# Patient Record
Sex: Female | Born: 1945
Health system: Southern US, Community
[De-identification: ages and names within clinical notes are randomized; demographics above are authoritative.]

## PROBLEM LIST (undated history)

## (undated) DIAGNOSIS — N289 Disorder of kidney and ureter, unspecified: Secondary | ICD-10-CM

## (undated) DIAGNOSIS — I10 Essential (primary) hypertension: Secondary | ICD-10-CM

---

## 1998-02-04 ENCOUNTER — Ambulatory Visit (HOSPITAL_COMMUNITY): Admission: RE | Admit: 1998-02-04 | Discharge: 1998-02-04 | Payer: Self-pay | Admitting: Gastroenterology

## 1998-02-04 ENCOUNTER — Encounter: Payer: Self-pay | Admitting: Gastroenterology

## 1998-04-09 ENCOUNTER — Ambulatory Visit (HOSPITAL_COMMUNITY): Admission: RE | Admit: 1998-04-09 | Discharge: 1998-04-09 | Payer: Self-pay | Admitting: Gastroenterology

## 1998-07-08 ENCOUNTER — Encounter: Payer: Self-pay | Admitting: General Surgery

## 1998-07-12 ENCOUNTER — Observation Stay (HOSPITAL_COMMUNITY): Admission: RE | Admit: 1998-07-12 | Discharge: 1998-07-13 | Payer: Self-pay | Admitting: General Surgery

## 1999-03-23 ENCOUNTER — Encounter: Payer: Self-pay | Admitting: Family Medicine

## 1999-03-23 ENCOUNTER — Encounter: Admission: RE | Admit: 1999-03-23 | Discharge: 1999-03-23 | Payer: Self-pay | Admitting: Family Medicine

## 2000-04-02 ENCOUNTER — Encounter: Admission: RE | Admit: 2000-04-02 | Discharge: 2000-04-02 | Payer: Self-pay | Admitting: Family Medicine

## 2000-04-02 ENCOUNTER — Encounter: Payer: Self-pay | Admitting: Family Medicine

## 2000-07-09 ENCOUNTER — Other Ambulatory Visit: Admission: RE | Admit: 2000-07-09 | Discharge: 2000-07-09 | Payer: Self-pay | Admitting: Obstetrics and Gynecology

## 2001-04-15 ENCOUNTER — Encounter: Admission: RE | Admit: 2001-04-15 | Discharge: 2001-04-15 | Payer: Self-pay | Admitting: Family Medicine

## 2001-04-15 ENCOUNTER — Encounter: Payer: Self-pay | Admitting: Family Medicine

## 2001-06-25 ENCOUNTER — Encounter (INDEPENDENT_AMBULATORY_CARE_PROVIDER_SITE_OTHER): Payer: Self-pay | Admitting: Specialist

## 2001-06-25 ENCOUNTER — Ambulatory Visit (HOSPITAL_COMMUNITY): Admission: RE | Admit: 2001-06-25 | Discharge: 2001-06-25 | Payer: Self-pay | Admitting: Gastroenterology

## 2001-07-22 ENCOUNTER — Other Ambulatory Visit: Admission: RE | Admit: 2001-07-22 | Discharge: 2001-07-22 | Payer: Self-pay | Admitting: Obstetrics and Gynecology

## 2002-04-21 ENCOUNTER — Encounter: Payer: Self-pay | Admitting: Family Medicine

## 2002-04-21 ENCOUNTER — Ambulatory Visit (HOSPITAL_COMMUNITY): Admission: RE | Admit: 2002-04-21 | Discharge: 2002-04-21 | Payer: Self-pay | Admitting: Family Medicine

## 2003-04-27 ENCOUNTER — Ambulatory Visit (HOSPITAL_COMMUNITY): Admission: RE | Admit: 2003-04-27 | Discharge: 2003-04-27 | Payer: Self-pay | Admitting: Family Medicine

## 2004-05-13 ENCOUNTER — Ambulatory Visit (HOSPITAL_COMMUNITY): Admission: RE | Admit: 2004-05-13 | Discharge: 2004-05-13 | Payer: Self-pay | Admitting: Obstetrics and Gynecology

## 2005-05-22 ENCOUNTER — Ambulatory Visit (HOSPITAL_COMMUNITY): Admission: RE | Admit: 2005-05-22 | Discharge: 2005-05-22 | Payer: Self-pay | Admitting: Obstetrics and Gynecology

## 2006-05-28 ENCOUNTER — Ambulatory Visit (HOSPITAL_COMMUNITY): Admission: RE | Admit: 2006-05-28 | Discharge: 2006-05-28 | Payer: Self-pay | Admitting: Family Medicine

## 2006-06-06 ENCOUNTER — Encounter: Admission: RE | Admit: 2006-06-06 | Discharge: 2006-06-06 | Payer: Self-pay | Admitting: Family Medicine

## 2007-06-20 ENCOUNTER — Ambulatory Visit (HOSPITAL_COMMUNITY): Admission: RE | Admit: 2007-06-20 | Discharge: 2007-06-20 | Payer: Self-pay | Admitting: Obstetrics and Gynecology

## 2010-05-22 ENCOUNTER — Encounter: Payer: Self-pay | Admitting: Family Medicine

## 2010-09-16 NOTE — Procedures (Signed)
[facility]  Patient:    Brandy Salazar, Brandy Salazar Visit Number: [id number] MRN: [id number]          Service Type: END Location: ENDO Attending Physician:  Rafael Bihari Dictated by:   Elyse Jarvis Amedeo Plenty, M.D. Proc. Date: 06/25/01 Admit Date:  06/25/2001                             Procedure Report  PROCEDURE:  Colonoscopy.  INDICATION FOR PROCEDURE:  History of adenomatous colon polyps three years ago.  DESCRIPTION OF PROCEDURE:  The patient was placed in the left lateral decubitus position and placed on the pulse monitor with continuous low-flow oxygen delivered by nasal cannula.  She was sedated with 90 mg of IV Demerol and 10 mg of IV Versed.  The Olympus video colonoscope was inserted into the rectum and advanced to the cecum, confirmed by transillumination at McBurneys point and visualization of the ileocecal valve and appendiceal orifice.  The prep was good.  The cecum, ascending, and transverse colon all appeared normal with no masses, polyps, diverticula or other mucosal abnormalities.  Within the descending colon, there was a small 8 mm polyp which was fulgurated by hot biopsy, and there was a similar 8 mm polyp seen in the sigmoid colon.  Three or four other small but similar-appearing polyps were seen in the rectum and rectosigmoid area and were not fulgurated.  There were no diverticula or other mucosal abnormalities seen.  The colonoscope was then withdrawn, and the patient returned to the recovery room in stable condition.  She tolerated the procedure well, and there were no immediate complications.  IMPRESSION:  Sigmoid and descending colon polyps with diminutive rectal polyps.  PLAN:  Await histology and will probably pursue repeat colonoscopy in 3-5 years. Dictated by:   Elyse Jarvis Amedeo Plenty, M.D. Attending Physician:  Rafael Bihari DD:  06/25/01 TD:  06/25/01 Job: [id number]

## 2012-02-22 ENCOUNTER — Other Ambulatory Visit: Payer: Self-pay | Admitting: Gastroenterology

## 2013-08-21 ENCOUNTER — Other Ambulatory Visit (HOSPITAL_COMMUNITY)
Admission: RE | Admit: 2013-08-21 | Discharge: 2013-08-21 | Disposition: A | Discharge status: Home / Self Care | Payer: Medicare PPO | Source: Ambulatory Visit | Attending: Family Medicine | Admitting: Family Medicine

## 2013-08-21 ENCOUNTER — Other Ambulatory Visit: Payer: Self-pay | Admitting: Family Medicine

## 2013-08-21 DIAGNOSIS — Z124 Encounter for screening for malignant neoplasm of cervix: Secondary | ICD-10-CM | POA: Insufficient documentation

## 2016-03-17 ENCOUNTER — Other Ambulatory Visit: Payer: Self-pay | Admitting: Family Medicine

## 2016-03-17 DIAGNOSIS — Z1231 Encounter for screening mammogram for malignant neoplasm of breast: Secondary | ICD-10-CM

## 2016-04-12 ENCOUNTER — Ambulatory Visit
Admission: RE | Admit: 2016-04-12 | Discharge: 2016-04-12 | Disposition: A | Discharge status: Home / Self Care | Payer: Commercial Managed Care - HMO | Source: Ambulatory Visit | Attending: Family Medicine | Admitting: Family Medicine

## 2016-04-12 DIAGNOSIS — Z1231 Encounter for screening mammogram for malignant neoplasm of breast: Secondary | ICD-10-CM

## 2016-09-26 DIAGNOSIS — L720 Epidermal cyst: Secondary | ICD-10-CM | POA: Diagnosis not present

## 2016-09-26 DIAGNOSIS — I10 Essential (primary) hypertension: Secondary | ICD-10-CM | POA: Diagnosis not present

## 2016-09-26 DIAGNOSIS — E7800 Pure hypercholesterolemia, unspecified: Secondary | ICD-10-CM | POA: Diagnosis not present

## 2017-02-09 DIAGNOSIS — Z720 Tobacco use: Secondary | ICD-10-CM | POA: Diagnosis not present

## 2017-02-09 DIAGNOSIS — I10 Essential (primary) hypertension: Secondary | ICD-10-CM | POA: Diagnosis not present

## 2017-02-09 DIAGNOSIS — H269 Unspecified cataract: Secondary | ICD-10-CM | POA: Diagnosis not present

## 2017-02-09 DIAGNOSIS — Z9049 Acquired absence of other specified parts of digestive tract: Secondary | ICD-10-CM | POA: Diagnosis not present

## 2017-03-29 DIAGNOSIS — M79606 Pain in leg, unspecified: Secondary | ICD-10-CM | POA: Diagnosis not present

## 2017-03-29 DIAGNOSIS — L720 Epidermal cyst: Secondary | ICD-10-CM | POA: Diagnosis not present

## 2017-03-29 DIAGNOSIS — I10 Essential (primary) hypertension: Secondary | ICD-10-CM | POA: Diagnosis not present

## 2017-03-29 DIAGNOSIS — Z23 Encounter for immunization: Secondary | ICD-10-CM | POA: Diagnosis not present

## 2017-04-03 DIAGNOSIS — M67441 Ganglion, right hand: Secondary | ICD-10-CM | POA: Diagnosis not present

## 2017-04-03 DIAGNOSIS — M19041 Primary osteoarthritis, right hand: Secondary | ICD-10-CM | POA: Diagnosis not present

## 2017-04-03 DIAGNOSIS — M6740 Ganglion, unspecified site: Secondary | ICD-10-CM | POA: Diagnosis not present

## 2017-04-20 ENCOUNTER — Other Ambulatory Visit: Payer: Self-pay | Admitting: Family Medicine

## 2017-04-20 DIAGNOSIS — Z1231 Encounter for screening mammogram for malignant neoplasm of breast: Secondary | ICD-10-CM

## 2017-04-25 ENCOUNTER — Ambulatory Visit
Admission: RE | Admit: 2017-04-25 | Discharge: 2017-04-25 | Disposition: A | Discharge status: Home / Self Care | Payer: Commercial Managed Care - HMO | Source: Ambulatory Visit | Attending: Family Medicine | Admitting: Family Medicine

## 2017-04-25 DIAGNOSIS — Z1231 Encounter for screening mammogram for malignant neoplasm of breast: Secondary | ICD-10-CM | POA: Diagnosis not present

## 2017-08-31 DIAGNOSIS — E876 Hypokalemia: Secondary | ICD-10-CM | POA: Diagnosis not present

## 2017-08-31 DIAGNOSIS — I10 Essential (primary) hypertension: Secondary | ICD-10-CM | POA: Diagnosis not present

## 2017-08-31 DIAGNOSIS — Z720 Tobacco use: Secondary | ICD-10-CM | POA: Diagnosis not present

## 2017-08-31 DIAGNOSIS — Z6824 Body mass index (BMI) 24.0-24.9, adult: Secondary | ICD-10-CM | POA: Diagnosis not present

## 2017-08-31 DIAGNOSIS — H269 Unspecified cataract: Secondary | ICD-10-CM | POA: Diagnosis not present

## 2017-10-05 DIAGNOSIS — I10 Essential (primary) hypertension: Secondary | ICD-10-CM | POA: Diagnosis not present

## 2017-10-05 DIAGNOSIS — M79606 Pain in leg, unspecified: Secondary | ICD-10-CM | POA: Diagnosis not present

## 2017-10-05 DIAGNOSIS — E7800 Pure hypercholesterolemia, unspecified: Secondary | ICD-10-CM | POA: Diagnosis not present

## 2017-10-05 DIAGNOSIS — Z1159 Encounter for screening for other viral diseases: Secondary | ICD-10-CM | POA: Diagnosis not present

## 2017-10-05 DIAGNOSIS — R51 Headache: Secondary | ICD-10-CM | POA: Diagnosis not present

## 2017-10-05 DIAGNOSIS — L720 Epidermal cyst: Secondary | ICD-10-CM | POA: Diagnosis not present

## 2018-01-18 DIAGNOSIS — D127 Benign neoplasm of rectosigmoid junction: Secondary | ICD-10-CM | POA: Diagnosis not present

## 2018-01-18 DIAGNOSIS — Z86010 Personal history of colonic polyps: Secondary | ICD-10-CM | POA: Diagnosis not present

## 2018-01-18 DIAGNOSIS — K5730 Diverticulosis of large intestine without perforation or abscess without bleeding: Secondary | ICD-10-CM | POA: Diagnosis not present

## 2018-01-18 DIAGNOSIS — D123 Benign neoplasm of transverse colon: Secondary | ICD-10-CM | POA: Diagnosis not present

## 2018-01-18 DIAGNOSIS — D122 Benign neoplasm of ascending colon: Secondary | ICD-10-CM | POA: Diagnosis not present

## 2018-01-22 DIAGNOSIS — D127 Benign neoplasm of rectosigmoid junction: Secondary | ICD-10-CM | POA: Diagnosis not present

## 2018-01-22 DIAGNOSIS — D123 Benign neoplasm of transverse colon: Secondary | ICD-10-CM | POA: Diagnosis not present

## 2018-01-22 DIAGNOSIS — D122 Benign neoplasm of ascending colon: Secondary | ICD-10-CM | POA: Diagnosis not present

## 2018-01-24 DIAGNOSIS — K625 Hemorrhage of anus and rectum: Secondary | ICD-10-CM | POA: Diagnosis not present

## 2018-01-28 DIAGNOSIS — K625 Hemorrhage of anus and rectum: Secondary | ICD-10-CM | POA: Diagnosis not present

## 2018-03-18 DIAGNOSIS — I10 Essential (primary) hypertension: Secondary | ICD-10-CM | POA: Diagnosis not present

## 2018-03-18 DIAGNOSIS — Z86010 Personal history of colonic polyps: Secondary | ICD-10-CM | POA: Diagnosis not present

## 2018-03-18 DIAGNOSIS — R030 Elevated blood-pressure reading, without diagnosis of hypertension: Secondary | ICD-10-CM | POA: Diagnosis not present

## 2018-04-16 DIAGNOSIS — I10 Essential (primary) hypertension: Secondary | ICD-10-CM | POA: Diagnosis not present

## 2018-04-16 DIAGNOSIS — K589 Irritable bowel syndrome without diarrhea: Secondary | ICD-10-CM | POA: Diagnosis not present

## 2018-04-16 DIAGNOSIS — Z23 Encounter for immunization: Secondary | ICD-10-CM | POA: Diagnosis not present

## 2018-05-02 ENCOUNTER — Encounter (HOSPITAL_COMMUNITY): Admission: EM | Disposition: E | Payer: Self-pay | Source: Home / Self Care | Attending: Internal Medicine

## 2018-05-02 ENCOUNTER — Emergency Department (HOSPITAL_COMMUNITY): Payer: Medicare PPO

## 2018-05-02 ENCOUNTER — Encounter (HOSPITAL_COMMUNITY): Payer: Self-pay

## 2018-05-02 ENCOUNTER — Inpatient Hospital Stay (HOSPITAL_COMMUNITY): Payer: Medicare PPO

## 2018-05-02 DIAGNOSIS — N184 Chronic kidney disease, stage 4 (severe): Secondary | ICD-10-CM | POA: Diagnosis not present

## 2018-05-02 DIAGNOSIS — D539 Nutritional anemia, unspecified: Secondary | ICD-10-CM | POA: Diagnosis present

## 2018-05-02 DIAGNOSIS — I255 Ischemic cardiomyopathy: Secondary | ICD-10-CM | POA: Diagnosis present

## 2018-05-02 DIAGNOSIS — R0989 Other specified symptoms and signs involving the circulatory and respiratory systems: Secondary | ICD-10-CM | POA: Diagnosis not present

## 2018-05-02 DIAGNOSIS — I272 Pulmonary hypertension, unspecified: Secondary | ICD-10-CM | POA: Diagnosis present

## 2018-05-02 DIAGNOSIS — I340 Nonrheumatic mitral (valve) insufficiency: Secondary | ICD-10-CM | POA: Diagnosis not present

## 2018-05-02 DIAGNOSIS — N2581 Secondary hyperparathyroidism of renal origin: Secondary | ICD-10-CM | POA: Diagnosis present

## 2018-05-02 DIAGNOSIS — D649 Anemia, unspecified: Secondary | ICD-10-CM | POA: Diagnosis not present

## 2018-05-02 DIAGNOSIS — Z515 Encounter for palliative care: Secondary | ICD-10-CM | POA: Diagnosis not present

## 2018-05-02 DIAGNOSIS — Z86010 Personal history of colonic polyps: Secondary | ICD-10-CM

## 2018-05-02 DIAGNOSIS — E875 Hyperkalemia: Secondary | ICD-10-CM | POA: Diagnosis not present

## 2018-05-02 DIAGNOSIS — I370 Nonrheumatic pulmonary valve stenosis: Secondary | ICD-10-CM

## 2018-05-02 DIAGNOSIS — T508X5A Adverse effect of diagnostic agents, initial encounter: Secondary | ICD-10-CM | POA: Diagnosis not present

## 2018-05-02 DIAGNOSIS — I130 Hypertensive heart and chronic kidney disease with heart failure and stage 1 through stage 4 chronic kidney disease, or unspecified chronic kidney disease: Secondary | ICD-10-CM | POA: Diagnosis present

## 2018-05-02 DIAGNOSIS — I5021 Acute systolic (congestive) heart failure: Secondary | ICD-10-CM | POA: Diagnosis present

## 2018-05-02 DIAGNOSIS — J811 Chronic pulmonary edema: Secondary | ICD-10-CM

## 2018-05-02 DIAGNOSIS — R05 Cough: Secondary | ICD-10-CM

## 2018-05-02 DIAGNOSIS — R570 Cardiogenic shock: Secondary | ICD-10-CM

## 2018-05-02 DIAGNOSIS — E669 Obesity, unspecified: Secondary | ICD-10-CM | POA: Diagnosis present

## 2018-05-02 DIAGNOSIS — R34 Anuria and oliguria: Secondary | ICD-10-CM | POA: Diagnosis present

## 2018-05-02 DIAGNOSIS — I361 Nonrheumatic tricuspid (valve) insufficiency: Secondary | ICD-10-CM

## 2018-05-02 DIAGNOSIS — R059 Cough, unspecified: Secondary | ICD-10-CM

## 2018-05-02 DIAGNOSIS — I472 Ventricular tachycardia: Secondary | ICD-10-CM | POA: Diagnosis not present

## 2018-05-02 DIAGNOSIS — I480 Paroxysmal atrial fibrillation: Secondary | ICD-10-CM | POA: Diagnosis not present

## 2018-05-02 DIAGNOSIS — Z7189 Other specified counseling: Secondary | ICD-10-CM | POA: Diagnosis not present

## 2018-05-02 DIAGNOSIS — D696 Thrombocytopenia, unspecified: Secondary | ICD-10-CM | POA: Diagnosis not present

## 2018-05-02 DIAGNOSIS — I2510 Atherosclerotic heart disease of native coronary artery without angina pectoris: Secondary | ICD-10-CM | POA: Diagnosis present

## 2018-05-02 DIAGNOSIS — I493 Ventricular premature depolarization: Secondary | ICD-10-CM | POA: Diagnosis not present

## 2018-05-02 DIAGNOSIS — I081 Rheumatic disorders of both mitral and tricuspid valves: Secondary | ICD-10-CM | POA: Diagnosis present

## 2018-05-02 DIAGNOSIS — E872 Acidosis: Secondary | ICD-10-CM | POA: Diagnosis not present

## 2018-05-02 DIAGNOSIS — I2102 ST elevation (STEMI) myocardial infarction involving left anterior descending coronary artery: Secondary | ICD-10-CM | POA: Diagnosis not present

## 2018-05-02 DIAGNOSIS — E1122 Type 2 diabetes mellitus with diabetic chronic kidney disease: Secondary | ICD-10-CM | POA: Diagnosis not present

## 2018-05-02 DIAGNOSIS — N170 Acute kidney failure with tubular necrosis: Secondary | ICD-10-CM | POA: Diagnosis not present

## 2018-05-02 DIAGNOSIS — D631 Anemia in chronic kidney disease: Secondary | ICD-10-CM | POA: Diagnosis not present

## 2018-05-02 DIAGNOSIS — K5900 Constipation, unspecified: Secondary | ICD-10-CM | POA: Diagnosis not present

## 2018-05-02 DIAGNOSIS — I213 ST elevation (STEMI) myocardial infarction of unspecified site: Secondary | ICD-10-CM | POA: Diagnosis not present

## 2018-05-02 DIAGNOSIS — Z23 Encounter for immunization: Secondary | ICD-10-CM | POA: Diagnosis not present

## 2018-05-02 DIAGNOSIS — N186 End stage renal disease: Secondary | ICD-10-CM | POA: Diagnosis not present

## 2018-05-02 DIAGNOSIS — I2119 ST elevation (STEMI) myocardial infarction involving other coronary artery of inferior wall: Secondary | ICD-10-CM | POA: Diagnosis not present

## 2018-05-02 DIAGNOSIS — E785 Hyperlipidemia, unspecified: Secondary | ICD-10-CM

## 2018-05-02 DIAGNOSIS — I509 Heart failure, unspecified: Secondary | ICD-10-CM

## 2018-05-02 DIAGNOSIS — E1165 Type 2 diabetes mellitus with hyperglycemia: Secondary | ICD-10-CM | POA: Diagnosis present

## 2018-05-02 DIAGNOSIS — Z992 Dependence on renal dialysis: Secondary | ICD-10-CM | POA: Diagnosis not present

## 2018-05-02 DIAGNOSIS — K921 Melena: Secondary | ICD-10-CM | POA: Diagnosis not present

## 2018-05-02 DIAGNOSIS — E118 Type 2 diabetes mellitus with unspecified complications: Secondary | ICD-10-CM | POA: Diagnosis not present

## 2018-05-02 DIAGNOSIS — R0602 Shortness of breath: Secondary | ICD-10-CM | POA: Diagnosis not present

## 2018-05-02 DIAGNOSIS — I2109 ST elevation (STEMI) myocardial infarction involving other coronary artery of anterior wall: Principal | ICD-10-CM | POA: Diagnosis present

## 2018-05-02 DIAGNOSIS — I4892 Unspecified atrial flutter: Secondary | ICD-10-CM | POA: Diagnosis not present

## 2018-05-02 DIAGNOSIS — N179 Acute kidney failure, unspecified: Secondary | ICD-10-CM | POA: Diagnosis not present

## 2018-05-02 DIAGNOSIS — E876 Hypokalemia: Secondary | ICD-10-CM | POA: Diagnosis not present

## 2018-05-02 DIAGNOSIS — Z66 Do not resuscitate: Secondary | ICD-10-CM | POA: Diagnosis not present

## 2018-05-02 DIAGNOSIS — E871 Hypo-osmolality and hyponatremia: Secondary | ICD-10-CM | POA: Diagnosis not present

## 2018-05-02 DIAGNOSIS — I219 Acute myocardial infarction, unspecified: Secondary | ICD-10-CM

## 2018-05-02 DIAGNOSIS — N185 Chronic kidney disease, stage 5: Secondary | ICD-10-CM

## 2018-05-02 DIAGNOSIS — J810 Acute pulmonary edema: Secondary | ICD-10-CM

## 2018-05-02 DIAGNOSIS — T82594A Other mechanical complication of infusion catheter, initial encounter: Secondary | ICD-10-CM

## 2018-05-02 DIAGNOSIS — I5023 Acute on chronic systolic (congestive) heart failure: Secondary | ICD-10-CM | POA: Diagnosis not present

## 2018-05-02 DIAGNOSIS — Z79899 Other long term (current) drug therapy: Secondary | ICD-10-CM

## 2018-05-02 DIAGNOSIS — F17210 Nicotine dependence, cigarettes, uncomplicated: Secondary | ICD-10-CM | POA: Diagnosis present

## 2018-05-02 DIAGNOSIS — I4891 Unspecified atrial fibrillation: Secondary | ICD-10-CM | POA: Diagnosis not present

## 2018-05-02 DIAGNOSIS — Z4682 Encounter for fitting and adjustment of non-vascular catheter: Secondary | ICD-10-CM | POA: Diagnosis not present

## 2018-05-02 HISTORY — DX: Disorder of kidney and ureter, unspecified: N28.9

## 2018-05-02 HISTORY — DX: Essential (primary) hypertension: I10

## 2018-05-02 LAB — DIFFERENTIAL
Abs Immature Granulocytes: 0.37 10*3/uL — ABNORMAL HIGH (ref 0.00–0.07)
BASOS ABS: 0 10*3/uL (ref 0.0–0.1)
Basophils Relative: 0 %
Eosinophils Absolute: 0.1 10*3/uL (ref 0.0–0.5)
Eosinophils Relative: 0 %
IMMATURE GRANULOCYTES: 2 %
LYMPHS PCT: 12 %
Lymphs Abs: 2.9 10*3/uL (ref 0.7–4.0)
Monocytes Absolute: 1.1 10*3/uL — ABNORMAL HIGH (ref 0.1–1.0)
Monocytes Relative: 5 %
NEUTROS ABS: 18.8 10*3/uL — AB (ref 1.7–7.7)
Neutrophils Relative %: 81 %

## 2018-05-02 LAB — LIPID PANEL
CHOLESTEROL: 211 mg/dL — AB (ref 0–200)
HDL: 87 mg/dL (ref >40)
LDL Cholesterol: 108 mg/dL — ABNORMAL HIGH (ref 0–99)
Total CHOL/HDL Ratio: 2.4 RATIO
Triglycerides: 79 mg/dL (ref <150)
VLDL: 16 mg/dL (ref 0–40)

## 2018-05-02 LAB — I-STAT TROPONIN, ED: Troponin i, poc: >30 ng/mL (ref 0.00–0.08)

## 2018-05-02 LAB — CBC
HCT: 35.2 % — ABNORMAL LOW (ref 36.0–46.0)
HEMOGLOBIN: 11.8 g/dL — AB (ref 12.0–15.0)
MCH: 32.9 pg (ref 26.0–34.0)
MCHC: 33.5 g/dL (ref 30.0–36.0)
MCV: 98.1 fL (ref 80.0–100.0)
NRBC: 0 % (ref 0.0–0.2)
Platelets: 177 10*3/uL (ref 150–400)
RBC: 3.59 MIL/uL — ABNORMAL LOW (ref 3.87–5.11)
RDW: 13.3 % (ref 11.5–15.5)
WBC: 23.3 10*3/uL — AB (ref 4.0–10.5)

## 2018-05-02 LAB — I-STAT CHEM 8, ED
BUN: 71 mg/dL — ABNORMAL HIGH (ref 8–23)
Calcium, Ion: 1.05 mmol/L — ABNORMAL LOW (ref 1.15–1.40)
Chloride: 95 mmol/L — ABNORMAL LOW (ref 98–111)
Creatinine, Ser: 5.1 mg/dL — ABNORMAL HIGH (ref 0.44–1.00)
Glucose, Bld: 192 mg/dL — ABNORMAL HIGH (ref 70–99)
HCT: 40 % (ref 36.0–46.0)
Hemoglobin: 13.6 g/dL (ref 12.0–15.0)
POTASSIUM: 4.2 mmol/L (ref 3.5–5.1)
Sodium: 131 mmol/L — ABNORMAL LOW (ref 135–145)
TCO2: 20 mmol/L — ABNORMAL LOW (ref 22–32)

## 2018-05-02 LAB — COMPREHENSIVE METABOLIC PANEL
ALBUMIN: 3.2 g/dL — AB (ref 3.5–5.0)
ALK PHOS: 60 U/L (ref 38–126)
ALT: 96 U/L — AB (ref 0–44)
AST: 203 U/L — ABNORMAL HIGH (ref 15–41)
Anion gap: 25 — ABNORMAL HIGH (ref 5–15)
BUN: 69 mg/dL — ABNORMAL HIGH (ref 8–23)
CALCIUM: 9.2 mg/dL (ref 8.9–10.3)
CO2: 17 mmol/L — AB (ref 22–32)
CREATININE: 5.19 mg/dL — AB (ref 0.44–1.00)
Chloride: 93 mmol/L — ABNORMAL LOW (ref 98–111)
GFR calc Af Amer: 9 mL/min — ABNORMAL LOW (ref >60)
GFR calc non Af Amer: 8 mL/min — ABNORMAL LOW (ref >60)
GLUCOSE: 192 mg/dL — AB (ref 70–99)
Potassium: 4.2 mmol/L (ref 3.5–5.1)
SODIUM: 135 mmol/L (ref 135–145)
Total Bilirubin: 0.6 mg/dL (ref 0.3–1.2)
Total Protein: 7.2 g/dL (ref 6.5–8.1)

## 2018-05-02 LAB — TROPONIN I: Troponin I: 62.64 ng/mL (ref <0.03)

## 2018-05-02 LAB — PROTIME-INR
INR: 1.34
Prothrombin Time: 16.4 seconds — ABNORMAL HIGH (ref 11.4–15.2)

## 2018-05-02 LAB — APTT: aPTT: 30 seconds (ref 24–36)

## 2018-05-02 SURGERY — CORONARY/GRAFT ACUTE MI REVASCULARIZATION
Anesthesia: LOCAL

## 2018-05-02 MED ORDER — ASPIRIN 81 MG PO CHEW
324.0000 mg | CHEWABLE_TABLET | Freq: Once | ORAL | Status: AC
  Administered 2018-05-02: 324 mg via ORAL
  Filled 2018-05-02: qty 4

## 2018-05-02 MED ORDER — HEPARIN (PORCINE) 25000 UT/250ML-% IV SOLN
800.0000 [IU]/h | INTRAVENOUS | Status: DC
  Administered 2018-05-02 – 2018-05-05 (×2): 750 [IU]/h via INTRAVENOUS
  Filled 2018-05-02 (×3): qty 250

## 2018-05-02 MED ORDER — SODIUM CHLORIDE 0.9 % IV SOLN
INTRAVENOUS | Status: DC
  Administered 2018-05-02: 500 mL via INTRAVENOUS
  Administered 2018-05-09 – 2018-05-11 (×3): via INTRAVENOUS

## 2018-05-02 MED ORDER — NOREPINEPHRINE BITARTRATE 1 MG/ML IV SOLN
0.0000 ug/min | INTRAVENOUS | Status: DC
  Administered 2018-05-02: 2 ug/min via INTRAVENOUS
  Administered 2018-05-03: 11 ug/min via INTRAVENOUS
  Administered 2018-05-15: 2 ug/min via INTRAVENOUS
  Filled 2018-05-02 (×4): qty 4

## 2018-05-02 MED ORDER — HEPARIN SODIUM (PORCINE) 5000 UNIT/ML IJ SOLN
60.0000 [IU]/kg | Freq: Once | INTRAMUSCULAR | Status: AC
  Administered 2018-05-02: 3600 [IU] via INTRAVENOUS
  Filled 2018-05-02: qty 1

## 2018-05-02 NOTE — Progress Notes (Addendum)
Chaplain responded to STEMI.  Neighbor Claudette who is friend and support on behalf of daughter who is in [place] was present.  Provided emotional and spiritual support to patient and friend.  Spoke briefly to daughter on phone to provide hospital telephone.  Patient appears weak and tired, but welcomed prayer.    Update: Chaplain encountered Claudette, patient's neighbor and support in [place] waiting area.  Facilitated communication with new medical staff and later escorted her to see patient.  Chaplain spoke briefly to patient, who appeared less pained, but very  sleepy.  RN mentioned that patient does not have a HCPOA/AD; RN had just spoken to patient about that and mentioned that it could be a good time for chaplain to broach the subject.  However, upon suggestion to patient that this could be a topic for conversation tomorrow, patient showed hesitation.  Chaplain suggested it could be a conversation for tomorrow. Patient nodded.  Will refer to unit chaplain.   Daughter in [place] is making arrangements to request FMLA and a flight to come to [facility] tomorrow.   Please contact as needed for ongoing support.  Luana Shu [phone]    05/03/2018 2200  Clinical Encounter Type  Visited With Patient and family together  Visit Type Initial;Other (Comment) (STEMI)  Referral From Care management  Consult/Referral To Chaplain  Spiritual Encounters  Spiritual Needs Prayer  Stress Factors  Patient Stress Factors Health changes  Family Stress Factors Family relationships;Health changes

## 2018-05-02 NOTE — ED Notes (Signed)
Activated code stemi @ 21:37 per Dr Tamera Punt

## 2018-05-02 NOTE — ED Notes (Signed)
EDP and Cardiologist explained plan of care to pt. and family ,

## 2018-05-02 NOTE — Progress Notes (Signed)
ANTICOAGULATION CONSULT NOTE - Initial Consult  Pharmacy Consult for heparin Indication: chest pain/ACS  Not on File  Patient Measurements: Weight: 132 lb (59.9 kg) Heparin Dosing Weight: 60kg  Vital Signs: Temp: 97.6 F (36.4 C) (01/02 2120) Temp Source: Oral (01/02 2120) BP: 75/60 (01/02 2200) Pulse Rate: 72 (01/02 2200)  Labs: Recent Labs    05/18/2018 2202  HGB 13.6  HCT 40.0  CREATININE 5.10*    CrCl cannot be calculated (Unknown ideal weight.).   Medical History: Past Medical History:  Diagnosis Date  . Hypertension   . Renal disorder    Assessment: 73 year old female presented to [facility] with weakness, ST elevations noted on EKG. Unfortunately after interview it would seem that patient's heart attack was several days ago, to complicate matters scr is now 5. Cath was cancelled. Will continue IV heparin for now for medical management. CBC within normal limits.   Goal of Therapy:  Heparin level 0.3-0.7 units/ml Monitor platelets by anticoagulation protocol: Yes   Plan:  Start heparin infusion at 750  units/hr Check anti-Xa level in 8 hours and daily while on heparin Continue to monitor H&H and platelets  Erin Hearing PharmD., BCPS Clinical Pharmacist 05/15/2018 10:20 PM

## 2018-05-02 NOTE — ED Provider Notes (Signed)
[facility] EMERGENCY DEPARTMENT Provider Note   CSN: [id number] Arrival date & time: 05/22/2018  2113     History   Chief Complaint Chief Complaint  Patient presents with  . Code STEMI    HPI Brandy Salazar is a 73 y.o. female.  Patient is a 73 year old female with a history of hypertension and kidney disease who presents as a code STEMI.  She was in triage and was noted to have ST elevation on her EKG.  Code STEMI was activated from triage.  She reports a 4-day history of generalized weakness with intermittent chest pain and tingling in both of her hands.  She also has some shortness of breath which worsened today and prompted her to come to the emergency room.  She denies any known history of cardiac disease.  She denies any current chest pain.     Past Medical History:  Diagnosis Date  . Hypertension   . Renal disorder     Patient Active Problem List   Diagnosis Date Noted  . ST elevation (STEMI) myocardial infarction ([place]) 05/04/2018    History reviewed. No pertinent surgical history.   OB History   No obstetric history on file.      Home Medications    Prior to Admission medications   Medication Sig Start Date End Date Taking? Authorizing Provider  cloNIDine (CATAPRES) 0.1 MG tablet Take 0.1 mg by mouth 2 (two) times daily.   Yes [provider]  hydrochlorothiazide (HYDRODIURIL) 25 MG tablet Take 25 mg by mouth daily.   Yes [provider]  lisinopril (PRINIVIL,ZESTRIL) 40 MG tablet Take 40 mg by mouth daily.   Yes [provider]  metoprolol succinate (TOPROL-XL) 100 MG 24 hr tablet Take 100 mg by mouth daily. Take with or immediately following a meal.   Yes [provider]  potassium chloride SA (K-DUR,KLOR-CON) 20 MEQ tablet Take 20 mEq by mouth daily.   Yes [provider]  verapamil (CALAN-SR) 240 MG CR tablet Take 240 mg by mouth daily.   Yes [provider]    Family History No  family history on file.  Social History Social History   Tobacco Use  . Smoking status: Not on file  Substance Use Topics  . Alcohol use: Not on file  . Drug use: Not on file     Allergies   Patient has no known allergies.   Review of Systems Review of Systems  Constitutional: Positive for fatigue. Negative for chills, diaphoresis and fever.  HENT: Positive for congestion. Negative for rhinorrhea and sneezing.   Eyes: Negative.   Respiratory: Positive for cough and shortness of breath. Negative for chest tightness.   Cardiovascular: Positive for chest pain. Negative for leg swelling.  Gastrointestinal: Negative for abdominal pain, blood in stool, diarrhea, nausea and vomiting.  Genitourinary: Negative for difficulty urinating, flank pain, frequency and hematuria.  Musculoskeletal: Negative for arthralgias and back pain.  Skin: Negative for rash.  Neurological: Positive for numbness (Tingling to both hands). Negative for dizziness, speech difficulty, weakness and headaches.     Physical Exam Updated Vital Signs BP (!) 82/64 (BP Location: Right Arm)   Pulse 68   Temp 97.6 F (36.4 C) (Oral)   Resp (!) 22   Wt 59.9 kg   SpO2 95%   Physical Exam Constitutional:      Appearance: She is well-developed.  HENT:     Head: Normocephalic and atraumatic.  Eyes:     Pupils: Pupils  are equal, round, and reactive to light.  Neck:     Musculoskeletal: Normal range of motion and neck supple.  Cardiovascular:     Rate and Rhythm: Normal rate and regular rhythm.     Heart sounds: Murmur present.  Pulmonary:     Effort: Pulmonary effort is normal. No respiratory distress.     Breath sounds: Normal breath sounds. No wheezing or rales.  Chest:     Chest wall: No tenderness.  Abdominal:     General: Bowel sounds are normal.     Palpations: Abdomen is soft.     Tenderness: There is no abdominal tenderness. There is no guarding or rebound.  Musculoskeletal: Normal range of  motion.  Lymphadenopathy:     Cervical: No cervical adenopathy.  Skin:    General: Skin is warm and dry.     Findings: No rash.  Neurological:     Mental Status: She is alert and oriented to person, place, and time.      ED Treatments / Results  Labs (all labs ordered are listed, but only abnormal results are displayed) Labs Reviewed  PROTIME-INR - Abnormal; Notable for the following components:      Result Value   Prothrombin Time 16.4 (*)    All other components within normal limits  CBC - Abnormal; Notable for the following components:   WBC 23.3 (*)    RBC 3.59 (*)    Hemoglobin 11.8 (*)    HCT 35.2 (*)    All other components within normal limits  DIFFERENTIAL - Abnormal; Notable for the following components:   Neutro Abs 18.8 (*)    Monocytes Absolute 1.1 (*)    Abs Immature Granulocytes 0.37 (*)    All other components within normal limits  LIPID PANEL - Abnormal; Notable for the following components:   Cholesterol 211 (*)    LDL Cholesterol 108 (*)    All other components within normal limits  I-STAT TROPONIN, ED - Abnormal; Notable for the following components:   Troponin i, poc >30.00 (*)    All other components within normal limits  I-STAT CHEM 8, ED - Abnormal; Notable for the following components:   Sodium 131 (*)    Chloride 95 (*)    BUN 71 (*)    Creatinine, Ser 5.10 (*)    Glucose, Bld 192 (*)    Calcium, Ion 1.05 (*)    TCO2 20 (*)    All other components within normal limits  APTT  COMPREHENSIVE METABOLIC PANEL  TROPONIN I  HEPARIN LEVEL (UNFRACTIONATED)  CBC    EKG EKG Interpretation  Date/Time:  Thursday May 02 2018 21:31:10 EST Ventricular Rate:  76 PR Interval:  122 QRS Duration: 96 QT Interval:  446 QTC Calculation: 501 R Axis:   -32 Text Interpretation:  Normal sinus rhythm Left axis deviation Lateral injury pattern Abnormal ECG ** ** ACUTE MI / STEMI ** ** Confirmed by Sherwood Gambler [phone]) on 05/13/2018 9:36:12  PM   Radiology Dg Chest Port 1 View  Result Date: 05/17/2018 CLINICAL DATA:  STEMI EXAM: PORTABLE CHEST 1 VIEW COMPARISON:  Report 07/08/1998 FINDINGS: Mild to moderate cardiomegaly. No acute consolidation or effusion. Aortic atherosclerosis. No pneumothorax. IMPRESSION: No active disease.  Cardiomegaly without edema. Electronically Signed   By: Donavan Foil M.D.   On: 05/11/2018 22:02    Procedures Procedures (including critical care time)  Medications Ordered in ED Medications  0.9 %  sodium chloride infusion (500 mLs Intravenous New Bag/Given 05/14/2018  2204)  heparin ADULT infusion 100 units/mL (25000 units/25mL sodium chloride 0.45%) (750 Units/hr Intravenous New Bag/Given 05/09/2018 2221)  norepinephrine (LEVOPHED) 4 mg in dextrose 5 % 250 mL (0.016 mg/mL) infusion (has no administration in time range)  aspirin chewable tablet 324 mg (324 mg Oral Given 05/05/2018 2159)  heparin injection 3,600 Units (3,600 Units Intravenous Given 05/08/2018 2200)     Initial Impression / Assessment and Plan / ED Course  I have reviewed the triage vital signs and the nursing notes.  Pertinent labs & imaging results that were available during my care of the patient were reviewed by me and considered in my medical decision making (see chart for details).     Patient is a 73 year old female who presents with 4-day history of chest pain shortness of breath and generalized weakness with tingling in her fingers.  She does not have any focal neurologic deficits.  Her EKG shows evidence of a STEMI.  Her troponin is markedly elevated.  Cardiology has seen the patient and felt that it would not be beneficial at this point to take her to the Cath Lab.  She also has a markedly elevated creatinine.  Her blood pressures continued to drop and she was started on Levophed.  She was admitted by cardiology to the ICU with CCM consulting.  CRITICAL CARE Performed by: Malvin Johns Total critical care time: 30 minutes Critical  care time was exclusive of separately billable procedures and treating other patients. Critical care was necessary to treat or prevent imminent or life-threatening deterioration. Critical care was time spent personally by me on the following activities: development of treatment plan with patient and/or surrogate as well as nursing, discussions with consultants, evaluation of patient's response to treatment, examination of patient, obtaining history from patient or surrogate, ordering and performing treatments and interventions, ordering and review of laboratory studies, ordering and review of radiographic studies, pulse oximetry and re-evaluation of patient's condition.   Final Clinical Impressions(s) / ED Diagnoses   Final diagnoses:  ST elevation myocardial infarction (STEMI), unspecified artery [facility])  Cardiogenic shock [facility])    ED Discharge Orders    None       Malvin Johns, MD 05/19/2018 2318

## 2018-05-02 NOTE — ED Notes (Signed)
Cardiologist notified on pt.'s hypotension .

## 2018-05-02 NOTE — ED Triage Notes (Signed)
Pt reports generalized weakness for the past 2 days along with bilateral hand numbness and feeling cold. No unilateral weakness or other neuro symptoms noted in triage. Pt having headache that started on her way to the ED.

## 2018-05-02 NOTE — H&P (Addendum)
CARDIOLOGY STEMI NOTE   73 year old African-American female with 4-day history of vague complaints that includes chest pain starting several days ago, weakness, and progression to dyspnea and nausea today.  Came to the ER because of weakness and poor appetite.  History partially obtained from her next-door neighbor  Primary physician is Dr. Donnie Coffin.  She has been told that she has poor kidney function, hypertension, and is a chronic tobacco user.  No known history of heart disease other than "heart murmur" in her 30s.  BP 85/60 mmHg heart rate 72 bpm.  Breathing comfortably.  Neck veins are distended.  Breath sounds are diminished at the bases bilaterally with rhonchi present.  Cardiac exam reveals an S4 gallop and left parasternal and apical systolic murmur 3 of 6.  Feet and hands are cool.  Neurologically, the patient is mentating but speech is somewhat slow.  Laboratory data of significance: Creatinine 5.1, BUN 70, initial troponin greater than 30, chest x-ray with cardiomegaly but no obvious pulmonary edema, and ECG demonstrates widespread ST elevation with well-formed Q waves II, III, aVF, V4, 5, and 6.  Late presenting large anterolateral and inferior infarction possibly related to a large wraparound LAD.  No current chest discomfort or dyspnea.  Troponin I is greater than 30.  Findings suggest myocardial infarction is 24 to 74 hours old.  Murmur raises question of ischemic mitral regurgitation.  Doubt VSD.  Chronic kidney failure, stage V is a poor prognostic indicator.  Recommend: Discussed CODE STATUS with the patient.  She does not want life support.  This desire is confirmed by her next-door neighbor who states she is always felt this way.  Echocardiogram tonight to assess for mechanical complications of late presenting MI.  Low dose levophed to get systolic blood pressure above 90 mmHg.  Not an emergency coronary angiography/mechanical revascularization candidate at this time.  STEMI  activation has been canceled.  Prognosis is grave with greater than 50 to 60% in-hospital mortality anticipated.  This was clearly discussed with the patient and her next-door neighbor.  CRITICAL CARE TIME = 35 minutes

## 2018-05-02 NOTE — ED Notes (Addendum)
Dr. Tamala Julian ( cardiologist ) explained plan of care and admission to pt. and family . Cardiologist notified on pt.'s elevated Troponin  result .

## 2018-05-03 ENCOUNTER — Inpatient Hospital Stay (HOSPITAL_COMMUNITY): Payer: Medicare PPO

## 2018-05-03 ENCOUNTER — Other Ambulatory Visit: Payer: Self-pay

## 2018-05-03 ENCOUNTER — Encounter (HOSPITAL_COMMUNITY): Payer: Self-pay

## 2018-05-03 DIAGNOSIS — N179 Acute kidney failure, unspecified: Secondary | ICD-10-CM

## 2018-05-03 DIAGNOSIS — I213 ST elevation (STEMI) myocardial infarction of unspecified site: Secondary | ICD-10-CM

## 2018-05-03 DIAGNOSIS — R570 Cardiogenic shock: Secondary | ICD-10-CM

## 2018-05-03 DIAGNOSIS — J810 Acute pulmonary edema: Secondary | ICD-10-CM

## 2018-05-03 DIAGNOSIS — E118 Type 2 diabetes mellitus with unspecified complications: Secondary | ICD-10-CM

## 2018-05-03 DIAGNOSIS — I340 Nonrheumatic mitral (valve) insufficiency: Secondary | ICD-10-CM

## 2018-05-03 DIAGNOSIS — D649 Anemia, unspecified: Secondary | ICD-10-CM

## 2018-05-03 DIAGNOSIS — E785 Hyperlipidemia, unspecified: Secondary | ICD-10-CM

## 2018-05-03 LAB — TROPONIN I
TROPONIN I: 32.89 ng/mL — AB (ref <0.03)
Troponin I: 59.14 ng/mL (ref <0.03)
Troponin I: 58.78 ng/mL (ref <0.03)

## 2018-05-03 LAB — COMPREHENSIVE METABOLIC PANEL
ALT: 97 U/L — ABNORMAL HIGH (ref 0–44)
ALT: 116 U/L — ABNORMAL HIGH (ref 0–44)
AST: 180 U/L — ABNORMAL HIGH (ref 15–41)
AST: 190 U/L — ABNORMAL HIGH (ref 15–41)
Albumin: 2.4 g/dL — ABNORMAL LOW (ref 3.5–5.0)
Albumin: 2.8 g/dL — ABNORMAL LOW (ref 3.5–5.0)
Alkaline Phosphatase: 55 U/L (ref 38–126)
Alkaline Phosphatase: 65 U/L (ref 38–126)
Anion gap: 17 — ABNORMAL HIGH (ref 5–15)
Anion gap: 19 — ABNORMAL HIGH (ref 5–15)
BUN: 73 mg/dL — ABNORMAL HIGH (ref 8–23)
BUN: 89 mg/dL — ABNORMAL HIGH (ref 8–23)
CHLORIDE: 81 mmol/L — AB (ref 98–111)
CHLORIDE: 96 mmol/L — AB (ref 98–111)
CO2: 17 mmol/L — ABNORMAL LOW (ref 22–32)
CO2: 19 mmol/L — ABNORMAL LOW (ref 22–32)
Calcium: 7.4 mg/dL — ABNORMAL LOW (ref 8.9–10.3)
Calcium: 8.3 mg/dL — ABNORMAL LOW (ref 8.9–10.3)
Creatinine, Ser: 4.86 mg/dL — ABNORMAL HIGH (ref 0.44–1.00)
Creatinine, Ser: 5.64 mg/dL — ABNORMAL HIGH (ref 0.44–1.00)
GFR calc Af Amer: 8 mL/min — ABNORMAL LOW (ref >60)
GFR calc non Af Amer: 8 mL/min — ABNORMAL LOW (ref >60)
GFR calc non Af Amer: 7 mL/min — ABNORMAL LOW (ref >60)
GFR, EST AFRICAN AMERICAN: 10 mL/min — AB (ref >60)
Glucose, Bld: 744 mg/dL (ref 70–99)
Glucose, Bld: 123 mg/dL — ABNORMAL HIGH (ref 70–99)
Potassium: 3.6 mmol/L (ref 3.5–5.1)
Potassium: 3.9 mmol/L (ref 3.5–5.1)
Sodium: 115 mmol/L — CL (ref 135–145)
Sodium: 134 mmol/L — ABNORMAL LOW (ref 135–145)
Total Bilirubin: 0.5 mg/dL (ref 0.3–1.2)
Total Bilirubin: 0.4 mg/dL (ref 0.3–1.2)
Total Protein: 5.3 g/dL — ABNORMAL LOW (ref 6.5–8.1)
Total Protein: 6.7 g/dL (ref 6.5–8.1)

## 2018-05-03 LAB — ECHOCARDIOGRAM LIMITED: Weight: 2112 [oz_av]

## 2018-05-03 LAB — CBC
HCT: 30.2 % — ABNORMAL LOW (ref 36.0–46.0)
Hemoglobin: 9.7 g/dL — ABNORMAL LOW (ref 12.0–15.0)
MCH: 31.3 pg (ref 26.0–34.0)
MCHC: 32.1 g/dL (ref 30.0–36.0)
MCV: 97.4 fL (ref 80.0–100.0)
Platelets: 142 K/uL — ABNORMAL LOW (ref 150–400)
RBC: 3.1 MIL/uL — ABNORMAL LOW (ref 3.87–5.11)
RDW: 13.6 % (ref 11.5–15.5)
WBC: 18.6 K/uL — ABNORMAL HIGH (ref 4.0–10.5)
nRBC: 0.1 % (ref 0.0–0.2)

## 2018-05-03 LAB — HEPARIN LEVEL (UNFRACTIONATED)
Heparin Unfractionated: 0.49 IU/mL (ref 0.30–0.70)
Heparin Unfractionated: 0.67 IU/mL (ref 0.30–0.70)

## 2018-05-03 LAB — GLUCOSE, CAPILLARY: Glucose-Capillary: 152 mg/dL — ABNORMAL HIGH (ref 70–99)

## 2018-05-03 LAB — MRSA PCR SCREENING: MRSA BY PCR: NEGATIVE

## 2018-05-03 MED ORDER — ASPIRIN 81 MG PO CHEW
81.0000 mg | CHEWABLE_TABLET | Freq: Every day | ORAL | Status: DC
  Administered 2018-05-04 – 2018-05-22 (×19): 81 mg via ORAL
  Filled 2018-05-03 (×19): qty 1

## 2018-05-03 MED ORDER — FENTANYL CITRATE (PF) 100 MCG/2ML IJ SOLN
12.5000 ug | Freq: Once | INTRAMUSCULAR | Status: AC
  Administered 2018-05-03: 12.5 ug via INTRAVENOUS
  Filled 2018-05-03: qty 2

## 2018-05-03 MED ORDER — ASPIRIN 81 MG PO CHEW
81.0000 mg | CHEWABLE_TABLET | Freq: Once | ORAL | Status: AC
  Administered 2018-05-03: 81 mg via ORAL
  Filled 2018-05-03: qty 1

## 2018-05-03 MED ORDER — ATORVASTATIN CALCIUM 80 MG PO TABS: 80.0000 mg | ORAL_TABLET | Freq: Every day | ORAL | Status: DC

## 2018-05-03 NOTE — Progress Notes (Signed)
@  2315 pt called out for mid sternal chest pain rating 6/10. 2L [place] applied, EKG performed & pt made comfortable in bed. EKG hand delievered to cardiologist. MD ordered 1 time dose of 12.75mcg of Fentanyl for pain & to attempt to wean from levophed. Due to high creatinine levels, pt being on levophed, & inability to send to cath lab, verbal orders to monitor pt closely.

## 2018-05-03 NOTE — Progress Notes (Signed)
eLink Physician-Brief Progress Note Patient Name: CODEE BLOODWORTH DOB: 1945-07-15 MRN: [id number]   Date of Service  05/03/2018  HPI/Events of Note  73 yr old female admitted to ICU for delayed STEMI with cardiogenic shock mostly from ischemic Mitral insufficiency. Earlier discussed with cardiologist.   Camera: Resting. MAP 81, HR ok. Looks comfortable. Data: Reviewed. On low dose levophed.  Code stemi cancelled  As per cards note.  Cr > ( CKD), K ok. Leukocytosis could be reactive. No fever.  Sepsis unlikely.   eICU Interventions  Continue care as per cardiology.  Watch for fever. If so evaluate for sepsis.      Intervention Category Major Interventions: Hypotension - evaluation and management Minor Interventions: Communication with other healthcare providers and/or family Evaluation Type: New Patient Evaluation  Elmer Sow 05/03/2018, 6:57 AM

## 2018-05-03 NOTE — Progress Notes (Signed)
ANTICOAGULATION CONSULT NOTE   Pharmacy Consult for heparin Indication: chest pain/ACS  No Known Allergies  Patient Measurements: Height: 5\' 3"  (160 cm) Weight: 126 lb 1.7 oz (57.2 kg) IBW/kg (Calculated) : 52.4 Heparin Dosing Weight: 60kg  Vital Signs: Temp: 98.1 F (36.7 C) (01/03 1210) Temp Source: Oral (01/03 1210) BP: 85/67 (01/03 1600) Pulse Rate: 68 (01/03 1600)  Labs: Recent Labs    05/27/2018 2154 05/27/2018 2202 05/03/18 0605 05/03/18 0938 05/03/18 1704  HGB 11.8* 13.6 9.7*  --   --   HCT 35.2* 40.0 30.2*  --   --   PLT 177  --  142*  --   --   APTT 30  --   --   --   --   LABPROT 16.4*  --   --   --   --   INR 1.34  --   --   --   --   HEPARINUNFRC  --   --  0.49  --  0.67  CREATININE 5.19* 5.10* 4.86* 5.64*  --   TROPONINI 62.64*  --  59.14* 58.78*  --     Estimated Creatinine Clearance: 7.5 mL/min (A) (by C-G formula based on SCr of 5.64 mg/dL (H)).   Medical History: Past Medical History:  Diagnosis Date  . Hypertension   . Renal disorder    Assessment: 73 year old female presented to [facility] with weakness, ST elevations noted on EKG. Unfortunately after interview it would seem that patient's heart attack was several days ago. On admission patient's SCr was 5.1. Cath was cancelled.   Patient's confirmatory heparin level is still therapeutic this afternoon at 0.67 but trending up. Patients Hgb decreased from 11.8 to 9.7 this AM, and platelet count decreased from 177 to 142.  Will drop down rate given trend. No bleeding issues noted.   Goal of Therapy:  Heparin level 0.3-0.7 units/ml Monitor platelets by anticoagulation protocol: Yes   Plan:  Reduce heparin infusion to 700 Units/hr Monitor anti-Xa level daily while on heparin Continue to monitor H&H, platelets, and for s/sx of bleeding  Thank you for allowing pharmacy to be a part of this patient's care. Erin Hearing PharmD., BCPS Clinical Pharmacist 05/03/2018 5:54 PM  Please check AMION for  all [facility] phone numbers 05/03/2018 5:51 PM

## 2018-05-03 NOTE — Progress Notes (Signed)
I responded to a Portal to provide Advance Directive information for the patient. I visited the patient's room and shared an overview of the AD. I answered her questions and left a copy of the AD for her to review with family and complete at a later time. I provided spiritual support by sharing words of encouragement and by leading in prayer.    05/03/18 1000  Clinical Encounter Type  Visited With Patient  Visit Type Spiritual support  Referral From Nurse  Consult/Referral To Chaplain  Spiritual Encounters  Spiritual Needs Prayer;Literature  Stress Factors  Patient Stress Factors None identified    Chaplain Dr Redgie Grayer

## 2018-05-03 NOTE — Progress Notes (Addendum)
Progress Note  Patient Name: Brandy Salazar Date of Encounter: 05/03/2018  Primary Cardiologist: new, Dr. Tamala Julian  Subjective   No recurrent chest pain  Inpatient Medications    Scheduled Meds: . atorvastatin  80 mg Oral q1800   Continuous Infusions: . sodium chloride 20 mL/hr at 05/03/18 0800  . heparin 750 Units/hr (05/03/18 0800)  . norepinephrine (LEVOPHED) Adult infusion 7 mcg/min (05/03/18 0800)   PRN Meds:    Vital Signs    Vitals:   05/03/18 0745 05/03/18 0800 05/03/18 0815 05/03/18 0830  BP: 98/72 97/72 95/72  97/73  Pulse: 66 70 66 68  Resp: (!) 27 (!) 21 (!) 25 (!) 27  Temp:      TempSrc:      SpO2: 99% 97% 100% 99%  Weight:      Height:        Intake/Output Summary (Last 24 hours) at 05/03/2018 0900 Last data filed at 05/03/2018 0800 Gross per 24 hour  Intake 727.67 ml  Output -  Net 727.67 ml    I/O since admission: +59  Filed Weights   05/11/2018 2131 05/03/18 0030  Weight: 59.9 kg 57.2 kg    Telemetry    Sinus in the 70s - Personally Reviewed  ECG    05/03/2018 ECG (independently read by me): Normal sinus rhythm at 71 bpm.  Q waves inferiorly with less ST elevation.  Persistent ST elevation V4 through V6.  1/2//2020 ECG (independently read by me): Normal sinus rhythm at 76 bpm.  Inferolateral ST elevation consistent with her late presentation STEMI  Physical Exam   BP 97/73   Pulse 68   Temp 97.9 F (36.6 C) (Oral)   Resp (!) 27   Ht 5\' 3"  (1.6 m)   Wt 57.2 kg   SpO2 99%   BMI 22.34 kg/m  General: Alert, oriented, no distress.  Skin: normal turgor, no rashes, warm and dry HEENT: Normocephalic, atraumatic. Pupils equal round and reactive to light; sclera anicteric; extraocular muscles intact;  Nose without nasal septal hypertrophy Mouth/Parynx benign; Mallinpatti scale 3 Neck: JVD 8 cm, no carotid bruits; normal carotid upstroke Lungs: decreased BS Chest wall: without tenderness to palpitation Heart: PMI not displaced, RRR, s1 s2  normal, 1-7/4 systolic murmur LSB and apex, no diastolic murmur, no rubs, gallops, thrills, or heaves Abdomen: mild HJR; soft, nontender; no hepatosplenomehaly, BS+; abdominal aorta nontender and not dilated by palpation. Back: no CVA tenderness Pulses 2+ Musculoskeletal: full range of motion, normal strength, no joint deformities Extremities: no clubbing cyanosis or edema, Homan's sign negative  Neurologic: grossly nonfocal; Cranial nerves grossly wnl Psychologic: Normal mood and affect   Labs    Chemistry Recent Labs  Lab 05/22/2018 2154 05/30/2018 2202 05/03/18 0605  NA 135 131* 115*  K 4.2 4.2 3.6  CL 93* 95* 81*  CO2 17*  --  17*  GLUCOSE 192* 192* 744*  BUN 69* 71* 73*  CREATININE 5.19* 5.10* 4.86*  CALCIUM 9.2  --  7.4*  PROT 7.2  --  5.3*  ALBUMIN 3.2*  --  2.4*  AST 203*  --  180*  ALT 96*  --  97*  ALKPHOS 60  --  55  BILITOT 0.6  --  0.5  GFRNONAA 8*  --  8*  GFRAA 9*  --  10*  ANIONGAP 25*  --  17*     Hematology Recent Labs  Lab 05/21/2018 2154 05/24/2018 2202 05/03/18 0605  WBC 23.3*  --  18.6*  RBC  3.59*  --  3.10*  HGB 11.8* 13.6 9.7*  HCT 35.2* 40.0 30.2*  MCV 98.1  --  97.4  MCH 32.9  --  31.3  MCHC 33.5  --  32.1  RDW 13.3  --  13.6  PLT 177  --  142*    Cardiac Enzymes Recent Labs  Lab 05/28/2018 2154 05/03/18 0605  TROPONINI 62.64* 59.14*    Recent Labs  Lab 05/21/2018 2200  TROPIPOC >30.00*     BNPNo results for input(s): BNP, PROBNP in the last 168 hours.   DDimer No results for input(s): DDIMER in the last 168 hours.   Lipid Panel     Component Value Date/Time   CHOL 211 (H) 05/05/2018 2154   TRIG 79 05/03/2018 2154   HDL 87 05/21/2018 2154   CHOLHDL 2.4 05/08/2018 2154   VLDL 16 05/28/2018 2154   LDLCALC 108 (H) 05/05/2018 2154     Radiology    Dg Chest Port 1 View  Result Date: 05/16/2018 CLINICAL DATA:  STEMI EXAM: PORTABLE CHEST 1 VIEW COMPARISON:  Report 07/08/1998 FINDINGS: Mild to moderate cardiomegaly. No acute  consolidation or effusion. Aortic atherosclerosis. No pneumothorax. IMPRESSION: No active disease.  Cardiomegaly without edema. Electronically Signed   By: Donavan Foil M.D.   On: 05/18/2018 22:02    Cardiac Studies   73 year old African-American female admitted May 02, 2018 with late presentation STEMI and acute kidney  Patient Profile     73 y.o. female admitted May 02, 2018 with late presentation anterolateral MI acute kidney injury with medical therapy approach.  Assessment & Plan   1. Inferolateral STEMI;  By clinical history, the patient has not been feeling well for the past 2 to 3 days prior to admission.  Initial troponin 62.6.  Patient states that she has been having off-and-on chest pain for several years.  She is currently on Levophed for low blood pressure with BP now in the low 42V systolically.  Not able to initiate any additional anti-ischemic meds presently due to low blood pressure.  Patient states she does not want intubation or mechanical ventilation; however, she is in agreement to undergo aggressive management including defibrillation if necessary.  2.  Suspect ischemia mediated mitral regurgitation with 2/6 to 3/6 systolic murmur radiating to her apex.  Will obtain 2D echo Doppler study today.  3.  Acute kidney injury.  The patient states she has never been seen by nephrologist.  Creatinine on admission is 5.19, slightly improved today to 4.86.  Patient has no desire to undergo dialysis.  However will obtain nephrology consult to assist with management.  4. Elevated transaminases: Probably contributed by acute MI.  Will not initiate statin presently.  5. Diabetes Mellitus with complication:  Glucose this morning questionably artifact at 744, since on presentation was 192.  Will recheck.  Finger blood stick was 152 at 7:50 AM  6. Hyperlipidemia with LDL 108; hold off statin initiation pending resolution of increased LFTs.  7.  Hyponatremia on early a.m. lab at  115; however, laboratory 8 hours previously was 131.  May be contributed by markedly elevated glucose.  8. Anemia  We will repeat laboratory, 2D echo Doppler study, portable chest x-ray ordered.  Time spent: 40 minutes  Signed, Troy Sine, MD, [facility] 05/03/2018, 9:00 AM

## 2018-05-03 NOTE — Progress Notes (Signed)
ANTICOAGULATION CONSULT NOTE - Initial Consult  Pharmacy Consult for heparin Indication: chest pain/ACS  No Known Allergies  Patient Measurements: Height: 5\' 3"  (160 cm) Weight: 126 lb 1.7 oz (57.2 kg) IBW/kg (Calculated) : 52.4 Heparin Dosing Weight: 60kg  Vital Signs: Temp: 97.9 F (36.6 C) (01/03 0725) Temp Source: Oral (01/03 0725) BP: 97/72 (01/03 0800) Pulse Rate: 70 (01/03 0800)  Labs: Recent Labs    05/21/2018 2154 05/27/2018 2202 05/03/18 0605  HGB 11.8* 13.6 9.7*  HCT 35.2* 40.0 30.2*  PLT 177  --  142*  APTT 30  --   --   LABPROT 16.4*  --   --   INR 1.34  --   --   HEPARINUNFRC  --   --  0.49  CREATININE 5.19* 5.10* 4.86*  TROPONINI 62.64*  --  59.14*    Estimated Creatinine Clearance: 8.7 mL/min (A) (by C-G formula based on SCr of 4.86 mg/dL (H)).   Medical History: Past Medical History:  Diagnosis Date  . Hypertension   . Renal disorder    Assessment: 73 year old female presented to [facility] with weakness, ST elevations noted on EKG. Unfortunately after interview it would seem that patient's heart attack was several days ago. On admission patient's SCr was 5.1. Cath was cancelled.   Patient's heparin level is therapeutic this AM at 0.49. Patients Hgb decreased from 11.8 to 9.7 this AM, and platelet count decreased from 177 to 142. Confirmed heparin infusion is still running this AM, and nurse confirmed patient has had no signs/symptoms of bleeding.   Goal of Therapy:  Heparin level 0.3-0.7 units/ml Monitor platelets by anticoagulation protocol: Yes   Plan:  Continue heparin infusion at 750 Units/hr Check confirmatory anti-Xa level in 8 hours  Monitor anti-Xa level daily while on heparin Continue to monitor H&H, platelets, and for s/sx of bleeding  Thank you for allowing pharmacy to be a part of this patient's care.  Leron Croak, PharmD PGY1 Pharmacy Resident Phone: [phone]  Please check AMION for all [place] phone  numbers 05/03/2018 8:12 AM

## 2018-05-03 NOTE — Consult Note (Signed)
[facility] Renal Consultation Note  Requesting MD:  Dr. Claiborne Billings Indication for Consultation:  AKI  HPI: Brandy Salazar is a 73 y.o. female with HTN, CKD by report who is currently admitted to cardiology with STEMI and nephrology is consulted for AKI.  She was admitted overnight on background of 4 days of weakness, dyspnea, chest pain.  She was found to have STEMI (trop 62) but given late presentation and no desire for 'life support' she's being managed conservatively.  She has cardiogenic shock and has been requiring NE (up to 78mcg, now 4) to maintain SBP ~90.    Her creatinine is in the 5s with normal electrolytes.  She's been ~anuric since admission.  She has no urinary symptoms, edema, dyspnea or current chest pain.   Lives alone, has neighbor that is best friend and helps with with errands (she does not drive herself).  Creatinine, Ser  Date/Time Value Ref Range Status  05/03/2018 09:38 AM 5.64 (H) 0.44 - 1.00 mg/dL Final  05/03/2018 06:05 AM 4.86 (H) 0.44 - 1.00 mg/dL Final  05/12/2018 10:02 PM 5.10 (H) 0.44 - 1.00 mg/dL Final  05/10/2018 09:54 PM 5.19 (H) 0.44 - 1.00 mg/dL Final    PMHx:   Past Medical History:  Diagnosis Date  . Hypertension   . Renal disorder     History reviewed. No pertinent surgical history.  Family Hx: History reviewed. No pertinent family history.  Social History:  reports that she has been smoking cigarettes. She has been smoking about 0.50 packs per day. She has never used smokeless tobacco. No history on file for alcohol and drug.  Allergies: No Known Allergies  Medications: Prior to Admission medications   Medication Sig Start Date End Date Taking? Authorizing Provider  cloNIDine (CATAPRES) 0.1 MG tablet Take 0.1 mg by mouth 2 (two) times daily.   Yes [provider]  hydrochlorothiazide (HYDRODIURIL) 25 MG tablet Take 25 mg by mouth daily.   Yes [provider]  lisinopril (PRINIVIL,ZESTRIL) 40 MG tablet  Take 40 mg by mouth daily.   Yes [provider]  metoprolol succinate (TOPROL-XL) 100 MG 24 hr tablet Take 100 mg by mouth daily. Take with or immediately following a meal.   Yes [provider]  potassium chloride SA (K-DUR,KLOR-CON) 20 MEQ tablet Take 20 mEq by mouth daily.   Yes [provider]  verapamil (CALAN-SR) 240 MG CR tablet Take 240 mg by mouth daily.   Yes [provider]    I have reviewed the patient's current medications.  Labs:  Results for orders placed or performed during the hospital encounter of 05/26/2018 (from the past 48 hour(s))  Protime-INR     Status: Abnormal   Collection Time: 05/11/2018  9:54 PM  Result Value Ref Range   Prothrombin Time 16.4 (H) 11.4 - 15.2 seconds   INR 1.34     Comment: Performed at [facility] [address] [place], [place] [address]  APTT     Status: None   Collection Time: 05/17/2018  9:54 PM  Result Value Ref Range   aPTT 30 24 - 36 seconds    Comment: Performed at [facility] [address], [place] [address]  CBC     Status: Abnormal   Collection Time: 05/22/2018  9:54 PM  Result Value Ref Range   WBC 23.3 (H) 4.0 - 10.5 K/uL   RBC 3.59 (L) 3.87 - 5.11 MIL/uL   Hemoglobin 11.8 (L) 12.0 -  15.0 g/dL   HCT 35.2 (L) 36.0 - 46.0 %   MCV 98.1 80.0 - 100.0 fL   MCH 32.9 26.0 - 34.0 pg   MCHC 33.5 30.0 - 36.0 g/dL   RDW 13.3 11.5 - 15.5 %   Platelets 177 150 - 400 K/uL   nRBC 0.0 0.0 - 0.2 %    Comment: Performed at [facility] [address], [place], [place] [address]  Differential     Status: Abnormal   Collection Time: 05/26/2018  9:54 PM  Result Value Ref Range   Neutrophils Relative % 81 %   Neutro Abs 18.8 (H) 1.7 - 7.7 K/uL   Lymphocytes Relative 12 %   Lymphs Abs 2.9 0.7 - 4.0 K/uL   Monocytes Relative 5 %   Monocytes Absolute 1.1 (H) 0.1 - 1.0 K/uL   Eosinophils Relative 0 %   Eosinophils Absolute 0.1 0.0 - 0.5 K/uL   Basophils Relative 0 %   Basophils  Absolute 0.0 0.0 - 0.1 K/uL   Immature Granulocytes 2 %   Abs Immature Granulocytes 0.37 (H) 0.00 - 0.07 K/uL    Comment: Performed at [facility] [address] [place], [place] [address]  Comprehensive metabolic panel     Status: Abnormal   Collection Time: 05/08/2018  9:54 PM  Result Value Ref Range   Sodium 135 135 - 145 mmol/L   Potassium 4.2 3.5 - 5.1 mmol/L   Chloride 93 (L) 98 - 111 mmol/L   CO2 17 (L) 22 - 32 mmol/L   Glucose, Bld 192 (H) 70 - 99 mg/dL   BUN 69 (H) 8 - 23 mg/dL   Creatinine, Ser 5.19 (H) 0.44 - 1.00 mg/dL   Calcium 9.2 8.9 - 10.3 mg/dL   Total Protein 7.2 6.5 - 8.1 g/dL   Albumin 3.2 (L) 3.5 - 5.0 g/dL   AST 203 (H) 15 - 41 U/L   ALT 96 (H) 0 - 44 U/L   Alkaline Phosphatase 60 38 - 126 U/L   Total Bilirubin 0.6 0.3 - 1.2 mg/dL   GFR calc non Af Amer 8 (L) >60 mL/min   GFR calc Af Amer 9 (L) >60 mL/min   Anion gap 25 (H) 5 - 15    Comment: Performed at [facility], [place] [address], [place], [place] [address]  Troponin I - ONCE - STAT     Status: Abnormal   Collection Time: 05/21/2018  9:54 PM  Result Value Ref Range   Troponin I 62.64 (HH) <0.03 ng/mL    Comment: CRITICAL RESULT CALLED TO, READ BACK BY AND VERIFIED WITH: SANGELANG B,RN 05/10/2018 2318 WAYK Performed at [facility], [place] [address], [place], [place] [address]   Lipid panel     Status: Abnormal   Collection Time: 05/11/2018  9:54 PM  Result Value Ref Range   Cholesterol 211 (H) 0 - 200 mg/dL   Triglycerides 79 <150 mg/dL   HDL 87 >40 mg/dL   Total CHOL/HDL Ratio 2.4 RATIO   VLDL 16 0 - 40 mg/dL   LDL Cholesterol 108 (H) 0 - 99 mg/dL    Comment:        Total Cholesterol/HDL:CHD Risk Coronary Heart Disease Risk Table                     Men   Women  1/2 Average Risk   3.4   3.3  Average Risk  5.0   4.4  2 X Average Risk   9.6   7.1  3 X Average Risk  23.4   11.0        Use the calculated Patient Ratio above and the CHD Risk Table to determine the patient's  CHD Risk.        ATP III CLASSIFICATION (LDL):  <100     mg/dL   Optimal  100-129  mg/dL   Near or Above                    Optimal  130-159  mg/dL   Borderline  160-189  mg/dL   High  >190     mg/dL   Very High Performed at [facility] [address], [place], [place] [address]   I-stat troponin, ED     Status: Abnormal   Collection Time: 05/14/2018 10:00 PM  Result Value Ref Range   Troponin i, poc >30.00 (HH) 0.00 - 0.08 ng/mL   Comment NOTIFIED PHYSICIAN    Comment 3            Comment: Due to the release kinetics of cTnI, a negative result within the first hours of the onset of symptoms does not rule out myocardial infarction with certainty. If myocardial infarction is still suspected, repeat the test at appropriate intervals.   I-stat Chem 8, ED     Status: Abnormal   Collection Time: 05/01/2018 10:02 PM  Result Value Ref Range   Sodium 131 (L) 135 - 145 mmol/L   Potassium 4.2 3.5 - 5.1 mmol/L   Chloride 95 (L) 98 - 111 mmol/L   BUN 71 (H) 8 - 23 mg/dL   Creatinine, Ser 5.10 (H) 0.44 - 1.00 mg/dL   Glucose, Bld 192 (H) 70 - 99 mg/dL   Calcium, Ion 1.05 (L) 1.15 - 1.40 mmol/L   TCO2 20 (L) 22 - 32 mmol/L   Hemoglobin 13.6 12.0 - 15.0 g/dL   HCT 40.0 36.0 - 46.0 %  MRSA PCR Screening     Status: None   Collection Time: 05/03/18 12:50 AM  Result Value Ref Range   MRSA by PCR NEGATIVE NEGATIVE    Comment:        The GeneXpert MRSA Assay (FDA approved for NASAL specimens only), is one component of a comprehensive MRSA colonization surveillance program. It is not intended to diagnose MRSA infection nor to guide or monitor treatment for MRSA infections. Performed at [facility], [place] [address] [place], [place] [address]   Heparin level (unfractionated)     Status: None   Collection Time: 05/03/18  6:05 AM  Result Value Ref Range   Heparin Unfractionated 0.49 0.30 - 0.70 IU/mL    Comment: (NOTE) If heparin results are below expected values, and  patient dosage has  been confirmed, suggest follow up testing of antithrombin III levels. Performed at [facility], [place] [address] [place], [place] [address]   CBC     Status: Abnormal   Collection Time: 05/03/18  6:05 AM  Result Value Ref Range   WBC 18.6 (H) 4.0 - 10.5 K/uL   RBC 3.10 (L) 3.87 - 5.11 MIL/uL   Hemoglobin 9.7 (L) 12.0 - 15.0 g/dL   HCT 30.2 (L) 36.0 - 46.0 %   MCV 97.4 80.0 - 100.0 fL   MCH 31.3 26.0 - 34.0 pg   MCHC 32.1 30.0 - 36.0 g/dL   RDW 13.6 11.5 - 15.5 %  Platelets 142 (L) 150 - 400 K/uL   nRBC 0.1 0.0 - 0.2 %    Comment: Performed at [facility], [place] [address], [place], [place] [address]  Comprehensive metabolic panel     Status: Abnormal   Collection Time: 05/03/18  6:05 AM  Result Value Ref Range   Sodium 115 (LL) 135 - 145 mmol/L    Comment: CRITICAL RESULT CALLED TO, READ BACK BY AND VERIFIED WITH: Azalia Bilis AT 9518 05/03/18 BY ZBEECH.    Potassium 3.6 3.5 - 5.1 mmol/L   Chloride 81 (L) 98 - 111 mmol/L   CO2 17 (L) 22 - 32 mmol/L   Glucose, Bld 744 (HH) 70 - 99 mg/dL    Comment: QUESTIONABLE RESULTS, RECOMMEND RECOLLECT TO VERIFY CRITICAL RESULT CALLED TO, READ BACK BY AND VERIFIED WITH: Azalia Bilis AT 8416 05/03/18 BY ZBEECH.    BUN 73 (H) 8 - 23 mg/dL   Creatinine, Ser 4.86 (H) 0.44 - 1.00 mg/dL   Calcium 7.4 (L) 8.9 - 10.3 mg/dL   Total Protein 5.3 (L) 6.5 - 8.1 g/dL   Albumin 2.4 (L) 3.5 - 5.0 g/dL   AST 180 (H) 15 - 41 U/L   ALT 97 (H) 0 - 44 U/L   Alkaline Phosphatase 55 38 - 126 U/L   Total Bilirubin 0.5 0.3 - 1.2 mg/dL   GFR calc non Af Amer 8 (L) >60 mL/min   GFR calc Af Amer 10 (L) >60 mL/min   Anion gap 17 (H) 5 - 15    Comment: Performed at [facility], [place] [address] [place], [place] [address]  Troponin I - Now Then Q6H     Status: Abnormal   Collection Time: 05/03/18  6:05 AM  Result Value Ref Range   Troponin I 59.14 (HH) <0.03 ng/mL    Comment: CRITICAL RESULT CALLED TO, READ BACK  BY AND VERIFIED WITH: Azalia Bilis AT 1601 05/03/18 BY ZBEECH. Performed at [facility], [place] [address], [place], [place] [address]   Glucose, capillary     Status: Abnormal   Collection Time: 05/03/18  7:47 AM  Result Value Ref Range   Glucose-Capillary 152 (H) 70 - 99 mg/dL  Comprehensive metabolic panel     Status: Abnormal   Collection Time: 05/03/18  9:38 AM  Result Value Ref Range   Sodium 134 (L) 135 - 145 mmol/L   Potassium 3.9 3.5 - 5.1 mmol/L   Chloride 96 (L) 98 - 111 mmol/L   CO2 19 (L) 22 - 32 mmol/L   Glucose, Bld 123 (H) 70 - 99 mg/dL   BUN 89 (H) 8 - 23 mg/dL   Creatinine, Ser 5.64 (H) 0.44 - 1.00 mg/dL   Calcium 8.3 (L) 8.9 - 10.3 mg/dL   Total Protein 6.7 6.5 - 8.1 g/dL   Albumin 2.8 (L) 3.5 - 5.0 g/dL   AST 190 (H) 15 - 41 U/L   ALT 116 (H) 0 - 44 U/L   Alkaline Phosphatase 65 38 - 126 U/L   Total Bilirubin 0.4 0.3 - 1.2 mg/dL   GFR calc non Af Amer 7 (L) >60 mL/min   GFR calc Af Amer 8 (L) >60 mL/min   Anion gap 19 (H) 5 - 15    Comment: Performed at [facility], [place] [address], [place], [place] [address]  Troponin I - ONCE - STAT     Status: Abnormal   Collection Time: 05/03/18  9:38 AM  Result Value Ref Range   Troponin I 58.78 (HH) <0.03 ng/mL    Comment: CRITICAL VALUE NOTED.  VALUE IS CONSISTENT WITH PREVIOUSLY REPORTED AND CALLED VALUE. Performed at [facility], [place] [address] [place], [place] [address]      ROS:  Pertinent items are noted in HPI.  Physical Exam: Vitals:   05/03/18 1115 05/03/18 1130  BP: (!) 87/68 (!) 89/71  Pulse: 66 69  Resp: (!) 25 (!) 26  Temp:    SpO2: 99% 100%     General: elderly woman comfortable in bed HEENT: MMM Eyes: anicteric Neck: supple, no JVD Heart: RRR, no rub, III/VI syst murmur LSB Lungs: normal WOB, a few coarse breath sounds, no rales Abdomen: soft, nontender Extremities: no edema Skin: cool and dry Neuro: nonfocal  Assessment/Plan:  **severe AKI: Secondary to  cardiogenic shock.  She is anuric since admission.  She has no indications for dialysis currently but I expect she will develop them.  We discussed conservative management versus dialysis.  She wishes to think about her options and I will meet with her again tomorrow to discuss.  I would hold on IV hydration for now as I think she will quickly become volume overloaded.  Small fluid boluses could be considered if she appears hypovolemic.  I do not suspect obstruction so I will not send her for a renal US at this time.    **hyponatremia on this morning's labs was spurious due to the hyperglycemia  **STEMI: LHC has been deferred due to late presentation.  Cardiology has obtained TTE which is pending.  If LHC planned please let me know -- I would think if she isn't willing to undergo dialysis this wouldn't be a viable option.   **Anemia, normocytic:  Hb 9.7, CTM.   Justin Mend 05/03/2018, 12:06 PM

## 2018-05-03 NOTE — Progress Notes (Addendum)
CRITICAL VALUE ALERT  Critical Value:  Sodium 115.  Glucose 744  Date & Time Notied:  05/03/2018  At 0740  Orders Received/Actions taken: RN unsure if lab values accurate based on previous results however Troponin level is in line.  RN obtained a capillary blood glucose and result was 152.  RN reordered comprehensive metabolic panel and troponin to confirm lab values and will discuss with MD during AM rounds.   Repeat lab results: Sodium 134.  Glucose 123

## 2018-05-04 DIAGNOSIS — I2102 ST elevation (STEMI) myocardial infarction involving left anterior descending coronary artery: Secondary | ICD-10-CM

## 2018-05-04 LAB — CBC
HCT: 30.3 % — ABNORMAL LOW (ref 36.0–46.0)
Hemoglobin: 10.5 g/dL — ABNORMAL LOW (ref 12.0–15.0)
MCH: 32.7 pg (ref 26.0–34.0)
MCHC: 34.7 g/dL (ref 30.0–36.0)
MCV: 94.4 fL (ref 80.0–100.0)
PLATELETS: 192 10*3/uL (ref 150–400)
RBC: 3.21 MIL/uL — ABNORMAL LOW (ref 3.87–5.11)
RDW: 13.1 % (ref 11.5–15.5)
WBC: 18.2 10*3/uL — ABNORMAL HIGH (ref 4.0–10.5)
nRBC: 0.5 % — ABNORMAL HIGH (ref 0.0–0.2)

## 2018-05-04 LAB — RENAL FUNCTION PANEL
Albumin: 2.6 g/dL — ABNORMAL LOW (ref 3.5–5.0)
Anion gap: 15 (ref 5–15)
BUN: 105 mg/dL — ABNORMAL HIGH (ref 8–23)
CO2: 22 mmol/L (ref 22–32)
Calcium: 8.1 mg/dL — ABNORMAL LOW (ref 8.9–10.3)
Chloride: 94 mmol/L — ABNORMAL LOW (ref 98–111)
Creatinine, Ser: 4.68 mg/dL — ABNORMAL HIGH (ref 0.44–1.00)
GFR calc Af Amer: 10 mL/min — ABNORMAL LOW (ref >60)
GFR calc non Af Amer: 9 mL/min — ABNORMAL LOW (ref >60)
Glucose, Bld: 127 mg/dL — ABNORMAL HIGH (ref 70–99)
POTASSIUM: 3.3 mmol/L — AB (ref 3.5–5.1)
Phosphorus: 3.1 mg/dL (ref 2.5–4.6)
Sodium: 131 mmol/L — ABNORMAL LOW (ref 135–145)

## 2018-05-04 LAB — HEPARIN LEVEL (UNFRACTIONATED): HEPARIN UNFRACTIONATED: 0.55 [IU]/mL (ref 0.30–0.70)

## 2018-05-04 MED ORDER — TRAMADOL HCL 50 MG PO TABS
50.0000 mg | ORAL_TABLET | Freq: Two times a day (BID) | ORAL | Status: DC | PRN
  Administered 2018-05-04 – 2018-05-22 (×6): 50 mg via ORAL
  Filled 2018-05-04 (×6): qty 1

## 2018-05-04 MED ORDER — AMIODARONE HCL IN DEXTROSE 360-4.14 MG/200ML-% IV SOLN
60.0000 mg/h | INTRAVENOUS | Status: DC
  Administered 2018-05-04 (×2): 60 mg/h via INTRAVENOUS
  Filled 2018-05-04 (×2): qty 200

## 2018-05-04 MED ORDER — AMIODARONE HCL IN DEXTROSE 360-4.14 MG/200ML-% IV SOLN
30.0000 mg/h | INTRAVENOUS | Status: DC
  Administered 2018-05-04: 30 mg/h via INTRAVENOUS
  Filled 2018-05-04 (×2): qty 200

## 2018-05-04 MED ORDER — WHITE PETROLATUM EX OINT
TOPICAL_OINTMENT | CUTANEOUS | Status: AC
  Administered 2018-05-04: 12:00:00
  Filled 2018-05-04: qty 28.35

## 2018-05-04 MED ORDER — POTASSIUM CHLORIDE CRYS ER 20 MEQ PO TBCR
20.0000 meq | EXTENDED_RELEASE_TABLET | Freq: Two times a day (BID) | ORAL | Status: AC
  Administered 2018-05-04 (×2): 20 meq via ORAL
  Filled 2018-05-04 (×2): qty 1

## 2018-05-04 MED ORDER — ATORVASTATIN CALCIUM 40 MG PO TABS
40.0000 mg | ORAL_TABLET | Freq: Every day | ORAL | Status: DC
  Administered 2018-05-04 – 2018-05-22 (×18): 40 mg via ORAL
  Filled 2018-05-04 (×18): qty 1

## 2018-05-04 MED ORDER — PNEUMOCOCCAL VAC POLYVALENT 25 MCG/0.5ML IJ INJ
0.5000 mL | INJECTION | INTRAMUSCULAR | Status: AC
  Administered 2018-05-09: 0.5 mL via INTRAMUSCULAR
  Filled 2018-05-04: qty 0.5

## 2018-05-04 MED ORDER — AMIODARONE LOAD VIA INFUSION
150.0000 mg | Freq: Once | INTRAVENOUS | Status: AC
  Administered 2018-05-04: 150 mg via INTRAVENOUS
  Filled 2018-05-04: qty 83.34

## 2018-05-04 NOTE — Progress Notes (Signed)
[facility] Progress Note   Subjective:   Had 6/10 substernal CP last night treated with fentanyl overnight. NE was off for several hours but resumed at 4am at 64mcg.  Making urine now - 612mL overnight. Ins 1200. TTE yesterday LVH, 40-45% EF, akinesis of mid-apicalanteroseptal and apical myocardium. Labs this AM with Na 131, K 3.3, BUN 105, Cr 4.68.  Remains on RA.  Objective Vitals:   05/04/18 0300 05/04/18 0400 05/04/18 0500 05/04/18 0600  BP: 106/79 107/81 97/66 108/80  Pulse: 70 76 70 73  Resp: (!) 24 (!) 31 (!) 21 (!) 21  Temp:  98.4 F (36.9 C)    TempSrc:  Oral    SpO2: 100% 100% 100% 100%  Weight:      Height:       Physical Exam General: seated in bedside chair, comfortable Heart:  RRR, II/VI syst murmur Lungs: dec BS bases, normal WOB, no rales Abdomen: soft, nontender Extremities: no edema  Additional Objective Labs: Basic Metabolic Panel: Recent Labs  Lab 05/03/18 0605 05/03/18 0938 05/04/18 0339  NA 115* 134* 131*  K 3.6 3.9 3.3*  CL 81* 96* 94*  CO2 17* 19* 22  GLUCOSE 744* 123* 127*  BUN 73* 89* 105*  CREATININE 4.86* 5.64* 4.68*  CALCIUM 7.4* 8.3* 8.1*  PHOS  --   --  3.1   Liver Function Tests: Recent Labs  Lab 05/30/2018 2154 05/03/18 0605 05/03/18 0938 05/04/18 0339  AST 203* 180* 190*  --   ALT 96* 97* 116*  --   ALKPHOS 60 55 65  --   BILITOT 0.6 0.5 0.4  --   PROT 7.2 5.3* 6.7  --   ALBUMIN 3.2* 2.4* 2.8* 2.6*   No results for input(s): LIPASE, AMYLASE in the last 168 hours. CBC: Recent Labs  Lab 05/14/2018 2154 05/21/2018 2202 05/03/18 0605 05/04/18 0339  WBC 23.3*  --  18.6* 18.2*  NEUTROABS 18.8*  --   --   --   HGB 11.8* 13.6 9.7* 10.5*  HCT 35.2* 40.0 30.2* 30.3*  MCV 98.1  --  97.4 94.4  PLT 177  --  142* 192   Blood Culture No results found for: SDES, SPECREQUEST, CULT, REPTSTATUS  Cardiac Enzymes: Recent Labs  Lab 05/20/2018 2154 05/03/18 0605 05/03/18 0938 05/03/18 1704  TROPONINI 62.64* 59.14*  58.78* 32.89*   CBG: Recent Labs  Lab 05/03/18 0747  GLUCAP 152*   Iron Studies: No results for input(s): IRON, TIBC, TRANSFERRIN, FERRITIN in the last 72 hours. @lablastinr3 @ Studies/Results: Dg Chest Port 1 View  Result Date: 05/03/2018 CLINICAL DATA:  Shortness of breath. EXAM: PORTABLE CHEST 1 VIEW COMPARISON:  Chest x-ray from yesterday. FINDINGS: Stable cardiomegaly. Normal pulmonary vascularity. No focal consolidation, pleural effusion, or pneumothorax. No acute osseous abnormality. IMPRESSION: Stable cardiomegaly.  No active disease. Electronically Signed   By: Titus Dubin M.D.   On: 05/03/2018 09:45   Dg Chest Port 1 View  Result Date: 05/10/2018 CLINICAL DATA:  STEMI EXAM: PORTABLE CHEST 1 VIEW COMPARISON:  Report 07/08/1998 FINDINGS: Mild to moderate cardiomegaly. No acute consolidation or effusion. Aortic atherosclerosis. No pneumothorax. IMPRESSION: No active disease.  Cardiomegaly without edema. Electronically Signed   By: Donavan Foil M.D.   On: 05/01/2018 22:02   Medications: . sodium chloride 20 mL/hr at 05/04/18 0700  . heparin 700 Units/hr (05/04/18 0700)  . norepinephrine (LEVOPHED) Adult infusion 2 mcg/min (05/04/18 0700)   . aspirin  81 mg Oral Daily   Assessment/Plan: **severe AKI:  Secondary to cardiogenic shock.  She has made some urine overnight (had been anuric through day yesterday) but BUN rising, sodium falling.  She has no current indications for RRT but I think they may develop in the next 24-72hrs, even if no contrast exposure and almost certainly if she has LHC with contrast.  We discussed dialysis versus conservative care in detail again today.  She desires to pursue dialysis if necessary, either short or long term is ok with her.    Hold on MIVF and give small isotonic fluid boluses if needed for hypovolemia.    **hyponatremia:  Likely due to impaired free water excretion in setting of severe AKI.  Heparin gtt is 1/2 NS and NE is D5W.  Avoid  further hypotonic fluids if possible, fluid restrict po to 1264mL  **hypokalemia:  Give KCl 20 BID today.   **STEMI: LHC has been deferred due to late presentation.  Please let me know if a heart cath is planned so we can anticipate dialysis needs.    **Anemia, normocytic:  Hb 10.5, CTM.   Jannifer Hick MD 05/04/2018, 7:42 AM  [facility] Pager: [phone]

## 2018-05-04 NOTE — Progress Notes (Addendum)
Progress Note  Patient Name: Brandy Salazar Date of Encounter: 05/04/2018   Primary Cardiologist: Dr Tamala Julian  Subjective   Patient had chest pain last evening that resolved with fentanyl.  No chest pain this morning.  She denies dyspnea.  Inpatient Medications    Scheduled Meds: . aspirin  81 mg Oral Daily  . potassium chloride  20 mEq Oral BID   Continuous Infusions: . sodium chloride 20 mL/hr at 05/04/18 0700  . heparin 700 Units/hr (05/04/18 0700)  . norepinephrine (LEVOPHED) Adult infusion 2 mcg/min (05/04/18 0700)   PRN Meds:    Vital Signs    Vitals:   05/04/18 0400 05/04/18 0500 05/04/18 0600 05/04/18 0755  BP: 107/81 97/66 108/80   Pulse: 76 70 73   Resp: (!) 31 (!) 21 (!) 21   Temp: 98.4 F (36.9 C)   (!) 97.1 F (36.2 C)  TempSrc: Oral   Axillary  SpO2: 100% 100% 100%   Weight:      Height:        Intake/Output Summary (Last 24 hours) at 05/04/2018 0807 Last data filed at 05/04/2018 0700 Gross per 24 hour  Intake 1151.26 ml  Output 600 ml  Net 551.26 ml   Filed Weights   05/22/2018 2131 05/03/18 0030  Weight: 59.9 kg 57.2 kg    Telemetry    Atrial flutter - Personally Reviewed  ECG    Atrial flutter with PVC; diffuse ST elevation; inferior Q waves - Personally Reviewed  Physical Exam   GEN: No acute distress.   Neck: No JVD Cardiac: RRR, 3/6 systolic murmur apex Respiratory: Diminished BS bases GI: Soft, nontender, non-distended  MS: No edema Neuro:  Nonfocal  Psych: Normal affect   Labs    Chemistry Recent Labs  Lab 05/13/2018 2154  05/03/18 0605 05/03/18 0938 05/04/18 0339  NA 135   < > 115* 134* 131*  K 4.2   < > 3.6 3.9 3.3*  CL 93*   < > 81* 96* 94*  CO2 17*  --  17* 19* 22  GLUCOSE 192*   < > 744* 123* 127*  BUN 69*   < > 73* 89* 105*  CREATININE 5.19*   < > 4.86* 5.64* 4.68*  CALCIUM 9.2  --  7.4* 8.3* 8.1*  PROT 7.2  --  5.3* 6.7  --   ALBUMIN 3.2*  --  2.4* 2.8* 2.6*  AST 203*  --  180* 190*  --   ALT 96*  --  97*  116*  --   ALKPHOS 60  --  55 65  --   BILITOT 0.6  --  0.5 0.4  --   GFRNONAA 8*  --  8* 7* 9*  GFRAA 9*  --  10* 8* 10*  ANIONGAP 25*  --  17* 19* 15   < > = values in this interval not displayed.     Hematology Recent Labs  Lab 05/11/2018 2154 05/05/2018 2202 05/03/18 0605 05/04/18 0339  WBC 23.3*  --  18.6* 18.2*  RBC 3.59*  --  3.10* 3.21*  HGB 11.8* 13.6 9.7* 10.5*  HCT 35.2* 40.0 30.2* 30.3*  MCV 98.1  --  97.4 94.4  MCH 32.9  --  31.3 32.7  MCHC 33.5  --  32.1 34.7  RDW 13.3  --  13.6 13.1  PLT 177  --  142* 192    Cardiac Enzymes Recent Labs  Lab 05/12/2018 2154 05/03/18 0605 05/03/18 0938 05/03/18 1704  TROPONINI  62.64* 59.14* 58.78* 32.89*    Recent Labs  Lab 05/20/2018 2200  TROPIPOC >30.00*     Radiology    Dg Chest Port 1 View  Result Date: 05/03/2018 CLINICAL DATA:  Shortness of breath. EXAM: PORTABLE CHEST 1 VIEW COMPARISON:  Chest x-ray from yesterday. FINDINGS: Stable cardiomegaly. Normal pulmonary vascularity. No focal consolidation, pleural effusion, or pneumothorax. No acute osseous abnormality. IMPRESSION: Stable cardiomegaly.  No active disease. Electronically Signed   By: Titus Dubin M.D.   On: 05/03/2018 09:45   Dg Chest Port 1 View  Result Date: 05/11/2018 CLINICAL DATA:  STEMI EXAM: PORTABLE CHEST 1 VIEW COMPARISON:  Report 07/08/1998 FINDINGS: Mild to moderate cardiomegaly. No acute consolidation or effusion. Aortic atherosclerosis. No pneumothorax. IMPRESSION: No active disease.  Cardiomegaly without edema. Electronically Signed   By: Donavan Foil M.D.   On: 05/20/2018 22:02    Patient Profile     73 y.o. female admitted May 02, 2018 with late presentation anterolateral MI acute kidney injury with medical therapy approach.  Assessment & Plan    1 acute anterolateral myocardial infarction-patient with late presentation.  She had some chest pain last evening but none this morning.  I personally reviewed patient's echocardiogram.  It  was a limited study and we will plan to repeat today.  She has allowed mitral regurgitation murmur on examination that was not fully assessed with previous echocardiogram.  Continue aspirin and heparin.  Blood pressure will not allow beta-blocker.  Add statin.  Patient now agreeable to dialysis.  I discussed the patient with nephrology who feels she will require dialysis in the next 48 to 72 hours regardless.  We will therefore consider cardiac catheterization though procedure is not urgent given late presentation.  Patient states she would be agreeable.  She does not want full code including no intubation, defibrillation or CPR.  Wean pressors as tolerated.  2 murmur-loud mitral regurgitation murmur on examination.  Repeat echocardiogram.  3 acute on chronic stage IV kidney disease-likely from recent infarct.  Nephrology following.  May need dialysis regardless of whether she undergoes cardiac catheterization.  Patient now agreeable.  4 hyperlipidemia-add statin.  5 atrial flutter-patient with new onset atrial flutter this morning.  She remains hypotensive requiring low-dose pressors.  I will add IV amiodarone.  Continue heparin.  Check TSH.  CRITICAL CARE Performed by: Kirk Ruths   Total critical care time: 35 minutes  Critical care time was exclusive of separately billable procedures and treating other patients.  Critical care was necessary to treat or prevent imminent or life-threatening deterioration.  Critical care was time spent personally by me on the following activities: development of treatment plan with patient and/or surrogate as well as nursing, discussions with consultants, evaluation of patient's response to treatment, examination of patient, obtaining history from patient or surrogate, ordering and performing treatments and interventions, ordering and review of laboratory studies, ordering and review of radiographic studies, pulse oximetry and re-evaluation of patient's  condition.   For questions or updates, please contact [place] Please consult [email or web address] for contact info under        Signed, Kirk Ruths, MD  05/04/2018, 8:07 AM

## 2018-05-04 NOTE — Progress Notes (Signed)
Pt reporting 5/10 CP, denies nausea/SOB. VSS. No rhythm changes noted on tele. Cards NP Phylliss Bob notified.

## 2018-05-04 NOTE — Progress Notes (Signed)
[place] for heparin Indication: chest pain/ACS  No Known Allergies  Patient Measurements: Height: 5\' 3"  (160 cm) Weight: 126 lb 1.7 oz (57.2 kg) IBW/kg (Calculated) : 52.4 Heparin Dosing Weight: 60kg  Vital Signs: Temp: 97.1 F (36.2 C) (01/04 0755) Temp Source: Axillary (01/04 0755) BP: 90/65 (01/04 0830) Pulse Rate: 118 (01/04 0812)  Labs: Recent Labs    05/27/2018 2154 05/01/2018 2202 05/03/18 0605 05/03/18 0938 05/03/18 1704 05/04/18 0339  HGB 11.8* 13.6 9.7*  --   --  10.5*  HCT 35.2* 40.0 30.2*  --   --  30.3*  PLT 177  --  142*  --   --  192  APTT 30  --   --   --   --   --   LABPROT 16.4*  --   --   --   --   --   INR 1.34  --   --   --   --   --   HEPARINUNFRC  --   --  0.49  --  0.67 0.55  CREATININE 5.19* 5.10* 4.86* 5.64*  --  4.68*  TROPONINI 62.64*  --  59.14* 58.78* 32.89*  --     Estimated Creatinine Clearance: 9 mL/min (A) (by C-G formula based on SCr of 4.68 mg/dL (H)).   Medical History: Past Medical History:  Diagnosis Date  . Hypertension   . Renal disorder    Assessment: 73 year old female presented to [facility] with weakness, ST elevations noted on EKG. Unfortunately after interview it would seem that patient's heart attack was several days ago. On admission patient's SCr was 5.1. Cath was cancelled.   Patient's confirmatory heparin level is still therapeutic this afternoon. Patients Hgb decreased from 11.8 to 9.7 this AM, and platelet count stable.  No overt bleeding or complications noted.  Goal of Therapy:  Heparin level 0.3-0.7 units/ml Monitor platelets by anticoagulation protocol: Yes   Plan:  Reduce heparin infusion to 700 Units/hr Monitor anti-Xa level daily while on heparin Continue to monitor H&H, platelets, and for s/sx of bleeding F/u plans for cath lab.  Marguerite Olea, [facility] Clinical Pharmacist Phone [phone]  05/04/2018 9:20 AM

## 2018-05-05 ENCOUNTER — Other Ambulatory Visit (HOSPITAL_COMMUNITY): Payer: Self-pay

## 2018-05-05 LAB — COMPREHENSIVE METABOLIC PANEL
ALK PHOS: 57 U/L (ref 38–126)
ALT: 79 U/L — ABNORMAL HIGH (ref 0–44)
AST: 55 U/L — ABNORMAL HIGH (ref 15–41)
Albumin: 2.4 g/dL — ABNORMAL LOW (ref 3.5–5.0)
Anion gap: 13 (ref 5–15)
BUN: 97 mg/dL — ABNORMAL HIGH (ref 8–23)
CO2: 21 mmol/L — ABNORMAL LOW (ref 22–32)
Calcium: 8.3 mg/dL — ABNORMAL LOW (ref 8.9–10.3)
Chloride: 94 mmol/L — ABNORMAL LOW (ref 98–111)
Creatinine, Ser: 3.65 mg/dL — ABNORMAL HIGH (ref 0.44–1.00)
GFR calc Af Amer: 14 mL/min — ABNORMAL LOW (ref >60)
GFR calc non Af Amer: 12 mL/min — ABNORMAL LOW (ref >60)
Glucose, Bld: 141 mg/dL — ABNORMAL HIGH (ref 70–99)
Potassium: 3.8 mmol/L (ref 3.5–5.1)
Sodium: 128 mmol/L — ABNORMAL LOW (ref 135–145)
Total Bilirubin: 0.2 mg/dL — ABNORMAL LOW (ref 0.3–1.2)
Total Protein: 5.7 g/dL — ABNORMAL LOW (ref 6.5–8.1)

## 2018-05-05 LAB — CBC
HEMATOCRIT: 28.2 % — AB (ref 36.0–46.0)
Hemoglobin: 9.5 g/dL — ABNORMAL LOW (ref 12.0–15.0)
MCH: 31.3 pg (ref 26.0–34.0)
MCHC: 33.7 g/dL (ref 30.0–36.0)
MCV: 92.8 fL (ref 80.0–100.0)
Platelets: 185 10*3/uL (ref 150–400)
RBC: 3.04 MIL/uL — ABNORMAL LOW (ref 3.87–5.11)
RDW: 12.8 % (ref 11.5–15.5)
WBC: 15 10*3/uL — ABNORMAL HIGH (ref 4.0–10.5)
nRBC: 1.1 % — ABNORMAL HIGH (ref 0.0–0.2)

## 2018-05-05 LAB — HEPARIN LEVEL (UNFRACTIONATED)
Heparin Unfractionated: 0.26 IU/mL — ABNORMAL LOW (ref 0.30–0.70)
Heparin Unfractionated: 0.31 IU/mL (ref 0.30–0.70)

## 2018-05-05 MED ORDER — AMIODARONE HCL 200 MG PO TABS
200.0000 mg | ORAL_TABLET | Freq: Two times a day (BID) | ORAL | Status: DC
  Administered 2018-05-05 – 2018-05-06 (×4): 200 mg via ORAL
  Filled 2018-05-05 (×4): qty 1

## 2018-05-05 MED ORDER — MORPHINE SULFATE (PF) 2 MG/ML IV SOLN
2.0000 mg | Freq: Once | INTRAVENOUS | Status: AC
  Administered 2018-05-05: 2 mg via INTRAVENOUS
  Filled 2018-05-05: qty 1

## 2018-05-05 MED ORDER — MENTHOL 3 MG MT LOZG
1.0000 | LOZENGE | OROMUCOSAL | Status: DC | PRN
  Filled 2018-05-05: qty 9

## 2018-05-05 MED ORDER — SODIUM CHLORIDE 0.9% FLUSH: 3.0000 mL | INTRAVENOUS | Status: DC | PRN

## 2018-05-05 MED ORDER — SODIUM CHLORIDE 0.9 % IV SOLN: 250.0000 mL | INTRAVENOUS | Status: DC | PRN

## 2018-05-05 MED ORDER — SODIUM CHLORIDE 0.9% FLUSH
3.0000 mL | Freq: Two times a day (BID) | INTRAVENOUS | Status: DC
  Administered 2018-05-05 – 2018-05-06 (×3): 3 mL via INTRAVENOUS

## 2018-05-05 MED ORDER — SODIUM CHLORIDE 0.9 % IV SOLN
INTRAVENOUS | Status: DC
  Administered 2018-05-06: 06:00:00 via INTRAVENOUS

## 2018-05-05 MED ORDER — ASPIRIN 81 MG PO CHEW
81.0000 mg | CHEWABLE_TABLET | ORAL | Status: DC
  Filled 2018-05-05: qty 1

## 2018-05-05 NOTE — Progress Notes (Signed)
[place] for heparin Indication: chest pain/ACS  No Known Allergies  Patient Measurements: Height: 5\' 3"  (160 cm) Weight: 132 lb 0.9 oz (59.9 kg) IBW/kg (Calculated) : 52.4 Heparin Dosing Weight: 60kg  Vital Signs: Temp: 97.6 F (36.4 C) (01/05 1546) Temp Source: Oral (01/05 1546) BP: 109/88 (01/05 2000) Pulse Rate: 86 (01/05 1922)  Labs: Recent Labs    05/04/2018 2154  05/03/18 0605 05/03/18 0938 05/03/18 1704 05/04/18 0339 05/05/18 0216 05/05/18 1916  HGB 11.8*   < > 9.7*  --   --  10.5* 9.5*  --   HCT 35.2*   < > 30.2*  --   --  30.3* 28.2*  --   PLT 177  --  142*  --   --  192 185  --   APTT 30  --   --   --   --   --   --   --   LABPROT 16.4*  --   --   --   --   --   --   --   INR 1.34  --   --   --   --   --   --   --   HEPARINUNFRC  --    < > 0.49  --  0.67 0.55 0.26* 0.31  CREATININE 5.19*   < > 4.86* 5.64*  --  4.68* 3.65*  --   TROPONINI 62.64*  --  59.14* 58.78* 32.89*  --   --   --    < > = values in this interval not displayed.    Estimated Creatinine Clearance: 11.5 mL/min (A) (by C-G formula based on SCr of 3.65 mg/dL (H)).   Medical History: Past Medical History:  Diagnosis Date  . Hypertension   . Renal disorder    Assessment: 73 year old female presented to [facility] with weakness, ST elevations noted on EKG. Unfortunately after interview it would seem that patient's heart attack was several days ago. On admission patient's SCr was 5.1. Cath was cancelled.   PM heparin level = 0.31  Goal of Therapy:  Heparin level 0.3-0.7 units/ml Monitor platelets by anticoagulation protocol: Yes   Plan:  Increase IV heparin to 800 units/hr  Monitor anti-Xa level daily while on heparin Continue to monitor H&H, platelets, and for s/sx of bleeding F/u plans for anticoagulation after cath tomorrow.  Thank you Anette Guarneri, PharmD [phone]  05/05/2018 8:52 PM

## 2018-05-05 NOTE — Progress Notes (Signed)
[facility]    NEPHROLOGY PROGRESS NOTE  SUBJECTIVE: Reports no acute complaints today.  Denies chest pain, shortness of breath, nausea, vomiting, diarrhea or dysuria.  Urine output has been reasonable.  All other review systems are negative.   OBJECTIVE:  Vitals:   05/05/18 0800 05/05/18 1156  BP: 96/67   Pulse: 76   Resp: (!) 30   Temp:  (!) 96.4 F (35.8 C)  SpO2: 99%    I/O last 3 completed shifts: In: 2101.7 [P.O.:300; I.V.:1801.7] Out: 0277 [Urine:1650]   [place]:  AAOx3 NAD HEENT: MMM [place] AT anicteric sclera Neck:  No JVD, no adenopathy CV:  Heart RRR with 3 out of 6 systolic murmur Lungs:  L/S CTA bilaterally Abd:  abd SNT/ND with normal BS GU:  Bladder non-palpable Extremities:  No LE edema. Skin:  No skin rash Neurologic: Nonfocal exam  MEDICATIONS:   Current Facility-Administered Medications:  .  0.9 %  sodium chloride infusion, , Intravenous, Continuous, Sherwood Gambler, MD, Last Rate: 20 mL/hr at 05/05/18 0800 .  amiodarone (PACERONE) tablet 200 mg, 200 mg, Oral, BID, Lelon Perla, MD, 200 mg at 05/05/18 1026 .  aspirin chewable tablet 81 mg, 81 mg, Oral, Daily, Troy Sine, MD, 81 mg at 05/05/18 1026 .  atorvastatin (LIPITOR) tablet 40 mg, 40 mg, Oral, q1800, Lelon Perla, MD, 40 mg at 05/04/18 1711 .  heparin ADULT infusion 100 units/mL (25000 units/260mL sodium chloride 0.45%), 750 Units/hr, Intravenous, Continuous, Carney, Jessica C, RPH, Last Rate: 7.5 mL/hr at 05/05/18 1058, 750 Units/hr at 05/05/18 1058 .  menthol-cetylpyridinium (CEPACOL) lozenge 3 mg, 1 lozenge, Oral, PRN, Stanford Breed, Denice Bors, MD .  norepinephrine (LEVOPHED) 4 mg in dextrose 5 % 250 mL (0.016 mg/mL) infusion, 0-40 mcg/min, Intravenous, Continuous, Malvin Johns, MD, Stopped at 05/04/18 2103 .  pneumococcal 23 valent vaccine (PNU-IMMUNE) injection 0.5 mL, 0.5 mL, Intramuscular, Tomorrow-1000, Crenshaw, Denice Bors, MD .  traMADol (ULTRAM) tablet 50 mg, 50 mg,  Oral, Q12H PRN, Daune Perch, NP, 50 mg at 05/04/18 1542     LABS:  CBC Latest Ref Rng & Units 05/05/2018 05/04/2018 05/03/2018  WBC 4.0 - 10.5 K/uL 15.0(H) 18.2(H) 18.6(H)  Hemoglobin 12.0 - 15.0 g/dL 9.5(L) 10.5(L) 9.7(L)  Hematocrit 36.0 - 46.0 % 28.2(L) 30.3(L) 30.2(L)  Platelets 150 - 400 K/uL 185 192 142(L)    CMP Latest Ref Rng & Units 05/05/2018 05/04/2018 05/03/2018  Glucose 70 - 99 mg/dL 141(H) 127(H) 123(H)  BUN 8 - 23 mg/dL 97(H) 105(H) 89(H)  Creatinine 0.44 - 1.00 mg/dL 3.65(H) 4.68(H) 5.64(H)  Sodium 135 - 145 mmol/L 128(L) 131(L) 134(L)  Potassium 3.5 - 5.1 mmol/L 3.8 3.3(L) 3.9  Chloride 98 - 111 mmol/L 94(L) 94(L) 96(L)  CO2 22 - 32 mmol/L 21(L) 22 19(L)  Calcium 8.9 - 10.3 mg/dL 8.3(L) 8.1(L) 8.3(L)  Total Protein 6.5 - 8.1 g/dL 5.7(L) - 6.7  Total Bilirubin 0.3 - 1.2 mg/dL 0.2(L) - 0.4  Alkaline Phos 38 - 126 U/L 57 - 65  AST 15 - 41 U/L 55(H) - 190(H)  ALT 0 - 44 U/L 79(H) - 116(H)    Lab Results  Component Value Date   CALCIUM 8.3 (L) 05/05/2018   CAION 1.05 (L) 05/22/2018   PHOS 3.1 05/04/2018    No results found for: COLORURINE, APPEARANCEUR, LABSPEC, PHURINE, GLUCOSEU, HGBUR, BILIRUBINUR, KETONESUR, PROTEINUR, UROBILINOGEN, NITRITE, LEUKOCYTESUR    Component Value Date/Time   TCO2 20 (L) 05/29/2018 2202    No results found for: IRON, TIBC,  FERRITIN, IRONPCTSAT     ASSESSMENT/PLAN:     Problem List Items Addressed This Visit      Cardiovascular and Mediastinum   ST elevation (STEMI) myocardial infarction [facility]) - Primary   Relevant Medications   aspirin chewable tablet 324 mg (Completed)   heparin injection 3,600 Units (Completed)   heparin ADULT infusion 100 units/mL (25000 units/244mL sodium chloride 0.45%)   cloNIDine (CATAPRES) 0.1 MG tablet   metoprolol succinate (TOPROL-XL) 100 MG 24 hr tablet   verapamil (CALAN-SR) 240 MG CR tablet   hydrochlorothiazide (HYDRODIURIL) 25 MG tablet   lisinopril (PRINIVIL,ZESTRIL) 40 MG tablet   norepinephrine  (LEVOPHED) 4 mg in dextrose 5 % 250 mL (0.016 mg/mL) infusion   aspirin chewable tablet 81 mg (Completed)   aspirin chewable tablet 81 mg   amiodarone (NEXTERONE) 1.8 mg/mL load via infusion 150 mg (Completed)   atorvastatin (LIPITOR) tablet 40 mg   amiodarone (PACERONE) tablet 200 mg   Cardiogenic shock (HCC)   Relevant Medications   aspirin chewable tablet 324 mg (Completed)   heparin injection 3,600 Units (Completed)   heparin ADULT infusion 100 units/mL (25000 units/262mL sodium chloride 0.45%)   cloNIDine (CATAPRES) 0.1 MG tablet   metoprolol succinate (TOPROL-XL) 100 MG 24 hr tablet   verapamil (CALAN-SR) 240 MG CR tablet   hydrochlorothiazide (HYDRODIURIL) 25 MG tablet   lisinopril (PRINIVIL,ZESTRIL) 40 MG tablet   norepinephrine (LEVOPHED) 4 mg in dextrose 5 % 250 mL (0.016 mg/mL) infusion   aspirin chewable tablet 81 mg (Completed)   aspirin chewable tablet 81 mg   amiodarone (NEXTERONE) 1.8 mg/mL load via infusion 150 mg (Completed)   atorvastatin (LIPITOR) tablet 40 mg   amiodarone (PACERONE) tablet 200 mg    Other Visit Diagnoses    Pulmonary edema       Relevant Orders   DG CHEST PORT 1 VIEW (Completed)   MI (myocardial infarction) (HCC)       Relevant Medications   aspirin chewable tablet 324 mg (Completed)   heparin injection 3,600 Units (Completed)   heparin ADULT infusion 100 units/mL (25000 units/288mL sodium chloride 0.45%)   cloNIDine (CATAPRES) 0.1 MG tablet   metoprolol succinate (TOPROL-XL) 100 MG 24 hr tablet   verapamil (CALAN-SR) 240 MG CR tablet   hydrochlorothiazide (HYDRODIURIL) 25 MG tablet   lisinopril (PRINIVIL,ZESTRIL) 40 MG tablet   norepinephrine (LEVOPHED) 4 mg in dextrose 5 % 250 mL (0.016 mg/mL) infusion   aspirin chewable tablet 81 mg (Completed)   aspirin chewable tablet 81 mg   amiodarone (NEXTERONE) 1.8 mg/mL load via infusion 150 mg (Completed)   atorvastatin (LIPITOR) tablet 40 mg   amiodarone (PACERONE) tablet 200 mg      1.   Acute kidney injury.  Creatinine on admission around 5.  Improved to 3.65 today.  Urine output has improved.  No urgent indication for dialysis.  Plans for cardiac catheterization noted.  We will continue to follow closely.  Hold lisinopril.  2.  Hyponatremia.  Avoid hypo-chronic fluids..  Likely secondary to severe kidney injury and decreased effective circulatory volume setting of heart failure.  3.  Acute anterolateral MI.  For cardiac catheterization tomorrow. 2D echocardiogram revealed: Limited study; full doppler not performed; akinesis of the distal   anteroseptal wall and apex with overall EF 40; severe LVH;   biatrial enlargement; severe RV dysfunction; moderate TR; mild   pulmonary hypertension.  4.  Anemia.  Continue to trend hemoglobin.       [place], DO, [place]

## 2018-05-05 NOTE — Progress Notes (Signed)
Progress Note  Patient Name: Brandy Salazar Date of Encounter: 05/05/2018   Primary Cardiologist: Dr Tamala Julian  Subjective   No CP or dyspnea  Inpatient Medications    Scheduled Meds: . aspirin  81 mg Oral Daily  . atorvastatin  40 mg Oral q1800  . pneumococcal 23 valent vaccine  0.5 mL Intramuscular Tomorrow-1000   Continuous Infusions: . sodium chloride 20 mL/hr at 05/05/18 0800  . amiodarone 30 mg/hr (05/05/18 0800)  . heparin 700 Units/hr (05/05/18 0800)  . norepinephrine (LEVOPHED) Adult infusion Stopped (05/04/18 2103)   PRN Meds:    Vital Signs    Vitals:   05/05/18 0700 05/05/18 0730 05/05/18 0744 05/05/18 0800  BP: 102/75 110/72  96/67  Pulse: 73 72  76  Resp: (!) 27 (!) 27  (!) 30  Temp:   98.2 F (36.8 C)   TempSrc:   Oral   SpO2: 98% 100%  99%  Weight:      Height:        Intake/Output Summary (Last 24 hours) at 05/05/2018 0829 Last data filed at 05/05/2018 0800 Gross per 24 hour  Intake 1454.57 ml  Output 1050 ml  Net 404.57 ml   Filed Weights   05/22/2018 2131 05/03/18 0030 05/05/18 0500  Weight: 59.9 kg 57.2 kg 59.9 kg    Telemetry    Sinus with PVCs- Personally Reviewed   Physical Exam   GEN: WD/WN No acute distress.   Neck: Supple Cardiac: RRR, 3/6 systolic murmur apex Respiratory: no wheeze GI: Soft, NT/ND MS: No edema Neuro:  Grossly intact  Labs    Chemistry Recent Labs  Lab 05/03/18 0605 05/03/18 0938 05/04/18 0339 05/05/18 0216  NA 115* 134* 131* 128*  K 3.6 3.9 3.3* 3.8  CL 81* 96* 94* 94*  CO2 17* 19* 22 21*  GLUCOSE 744* 123* 127* 141*  BUN 73* 89* 105* 97*  CREATININE 4.86* 5.64* 4.68* 3.65*  CALCIUM 7.4* 8.3* 8.1* 8.3*  PROT 5.3* 6.7  --  5.7*  ALBUMIN 2.4* 2.8* 2.6* 2.4*  AST 180* 190*  --  55*  ALT 97* 116*  --  79*  ALKPHOS 55 65  --  57  BILITOT 0.5 0.4  --  0.2*  GFRNONAA 8* 7* 9* 12*  GFRAA 10* 8* 10* 14*  ANIONGAP 17* 19* 15 13     Hematology Recent Labs  Lab 05/03/18 0605 05/04/18 0339  05/05/18 0216  WBC 18.6* 18.2* 15.0*  RBC 3.10* 3.21* 3.04*  HGB 9.7* 10.5* 9.5*  HCT 30.2* 30.3* 28.2*  MCV 97.4 94.4 92.8  MCH 31.3 32.7 31.3  MCHC 32.1 34.7 33.7  RDW 13.6 13.1 12.8  PLT 142* 192 185    Cardiac Enzymes Recent Labs  Lab 05/01/2018 2154 05/03/18 0605 05/03/18 0938 05/03/18 1704  TROPONINI 62.64* 59.14* 58.78* 32.89*    Recent Labs  Lab 05/24/2018 2200  TROPIPOC >30.00*     Radiology    Dg Chest Port 1 View  Result Date: 05/03/2018 CLINICAL DATA:  Shortness of breath. EXAM: PORTABLE CHEST 1 VIEW COMPARISON:  Chest x-ray from yesterday. FINDINGS: Stable cardiomegaly. Normal pulmonary vascularity. No focal consolidation, pleural effusion, or pneumothorax. No acute osseous abnormality. IMPRESSION: Stable cardiomegaly.  No active disease. Electronically Signed   By: Titus Dubin M.D.   On: 05/03/2018 09:45    Patient Profile     73 y.o. female admitted May 02, 2018 with late presentation anterolateral MI acute kidney injury with medical therapy approach.  Assessment &  Plan    1 acute anterolateral myocardial infarction-patient presented late with anterior lateral myocardial infarction.  She has had mild intermittent chest pain since that time.  Nephrology feels she will need dialysis in the near future regardless and patient would be agreeable to this.  She therefore would be agreeable to proceed with cardiac catheterization.  She understands this will likely precipitate dialysis.  I also discussed the risks of stroke, myocardial infarction and death.  She agrees to proceed and we will plan tomorrow.  Loud mitral regurgitation murmur on examination.  Check echocardiogram.  Continue aspirin, heparin and statin.  We will add beta-blocker in the next 24 to 48 hours if blood pressure allows.  She is now off of pressors.  Of note she stated yesterday she wants no CODE BLUE status.  2 murmur-loud mitral regurgitation murmur on examination.  Repeat  echocardiogram.  3 acute on chronic stage IV kidney disease-renal function slightly improved today.  We will continue to follow.  Patient agreeable to dialysis and nephrology following.  4 hyperlipidemia-continue lipitor  5 atrial flutter-patient has converted to sinus rhythm.  Plan to change IV amiodarone to oral.  Continue heparin.  CRITICAL CARE Performed by: Kirk Ruths   Total critical care time: 35 minutes  Critical care time was exclusive of separately billable procedures and treating other patients.  Critical care was necessary to treat or prevent imminent or life-threatening deterioration.  Critical care was time spent personally by me on the following activities: development of treatment plan with patient and/or surrogate as well as nursing, discussions with consultants, evaluation of patient's response to treatment, examination of patient, obtaining history from patient or surrogate, ordering and performing treatments and interventions, ordering and review of laboratory studies, ordering and review of radiographic studies, pulse oximetry and re-evaluation of patient's condition.   For questions or updates, please contact [place] Please consult [email or web address] for contact info under        Signed, Kirk Ruths, MD  05/05/2018, 8:29 AM

## 2018-05-05 NOTE — Progress Notes (Signed)
ANTICOAGULATION CONSULT NOTE   Pharmacy Consult for heparin Indication: chest pain/ACS  No Known Allergies  Patient Measurements: Height: 5\' 3"  (160 cm) Weight: 132 lb 0.9 oz (59.9 kg) IBW/kg (Calculated) : 52.4 Heparin Dosing Weight: 60kg  Vital Signs: Temp: 98.2 F (36.8 C) (01/05 0744) Temp Source: Oral (01/05 0744) BP: 96/67 (01/05 0800) Pulse Rate: 76 (01/05 0800)  Labs: Recent Labs    05/19/2018 2154  05/03/18 0605 05/03/18 0938 05/03/18 1704 05/04/18 0339 05/05/18 0216  HGB 11.8*   < > 9.7*  --   --  10.5* 9.5*  HCT 35.2*   < > 30.2*  --   --  30.3* 28.2*  PLT 177  --  142*  --   --  192 185  APTT 30  --   --   --   --   --   --   LABPROT 16.4*  --   --   --   --   --   --   INR 1.34  --   --   --   --   --   --   HEPARINUNFRC  --    < > 0.49  --  0.67 0.55 0.26*  CREATININE 5.19*   < > 4.86* 5.64*  --  4.68* 3.65*  TROPONINI 62.64*  --  59.14* 58.78* 32.89*  --   --    < > = values in this interval not displayed.    Estimated Creatinine Clearance: 11.5 mL/min (A) (by C-G formula based on SCr of 3.65 mg/dL (H)).   Medical History: Past Medical History:  Diagnosis Date  . Hypertension   . Renal disorder    Assessment: 73 year old female presented to [facility] with weakness, ST elevations noted on EKG. Unfortunately after interview it would seem that patient's heart attack was several days ago. On admission patient's SCr was 5.1. Cath was cancelled.   Patient's heparin level just slightly below goal this AM. Patients Hgb decreased to 9.5 this AM, and platelet count stable.  No overt bleeding or complications noted.  Goal of Therapy:  Heparin level 0.3-0.7 units/ml Monitor platelets by anticoagulation protocol: Yes   Plan:  Increase IV heparin to 750 units/hr Confirm level in 8 hrs Monitor anti-Xa level daily while on heparin Continue to monitor H&H, platelets, and for s/sx of bleeding F/u plans for anticoagulation after cath tomorrow.  Marguerite Olea, [facility] Clinical Pharmacist Phone [phone]  05/05/2018 10:51 AM

## 2018-05-06 ENCOUNTER — Inpatient Hospital Stay (HOSPITAL_COMMUNITY): Payer: Medicare PPO

## 2018-05-06 ENCOUNTER — Inpatient Hospital Stay (HOSPITAL_COMMUNITY): Admission: EM | Disposition: E | Payer: Self-pay | Source: Home / Self Care | Attending: Internal Medicine

## 2018-05-06 DIAGNOSIS — I370 Nonrheumatic pulmonary valve stenosis: Secondary | ICD-10-CM

## 2018-05-06 DIAGNOSIS — I2510 Atherosclerotic heart disease of native coronary artery without angina pectoris: Secondary | ICD-10-CM

## 2018-05-06 DIAGNOSIS — I361 Nonrheumatic tricuspid (valve) insufficiency: Secondary | ICD-10-CM

## 2018-05-06 HISTORY — PX: LEFT HEART CATH AND CORONARY ANGIOGRAPHY: CATH118249

## 2018-05-06 HISTORY — PX: ULTRASOUND GUIDANCE FOR VASCULAR ACCESS: SHX6516

## 2018-05-06 LAB — CBC
HCT: 29 % — ABNORMAL LOW (ref 36.0–46.0)
HCT: 28.7 % — ABNORMAL LOW (ref 36.0–46.0)
HEMOGLOBIN: 9.6 g/dL — AB (ref 12.0–15.0)
Hemoglobin: 9.7 g/dL — ABNORMAL LOW (ref 12.0–15.0)
MCH: 31.6 pg (ref 26.0–34.0)
MCH: 32.1 pg (ref 26.0–34.0)
MCHC: 33.4 g/dL (ref 30.0–36.0)
MCHC: 33.4 g/dL (ref 30.0–36.0)
MCV: 94.5 fL (ref 80.0–100.0)
MCV: 96 fL (ref 80.0–100.0)
Platelets: 206 10*3/uL (ref 150–400)
Platelets: 220 10*3/uL (ref 150–400)
RBC: 3.07 MIL/uL — ABNORMAL LOW (ref 3.87–5.11)
RBC: 2.99 MIL/uL — ABNORMAL LOW (ref 3.87–5.11)
RDW: 13 % (ref 11.5–15.5)
RDW: 13.3 % (ref 11.5–15.5)
WBC: 14 10*3/uL — AB (ref 4.0–10.5)
WBC: 15.6 10*3/uL — ABNORMAL HIGH (ref 4.0–10.5)
nRBC: 0.6 % — ABNORMAL HIGH (ref 0.0–0.2)
nRBC: 0.8 % — ABNORMAL HIGH (ref 0.0–0.2)

## 2018-05-06 LAB — BASIC METABOLIC PANEL
Anion gap: 10 (ref 5–15)
BUN: 80 mg/dL — ABNORMAL HIGH (ref 8–23)
CO2: 22 mmol/L (ref 22–32)
Calcium: 8.6 mg/dL — ABNORMAL LOW (ref 8.9–10.3)
Chloride: 97 mmol/L — ABNORMAL LOW (ref 98–111)
Creatinine, Ser: 2.87 mg/dL — ABNORMAL HIGH (ref 0.44–1.00)
GFR calc Af Amer: 18 mL/min — ABNORMAL LOW (ref >60)
GFR calc non Af Amer: 16 mL/min — ABNORMAL LOW (ref >60)
Glucose, Bld: 128 mg/dL — ABNORMAL HIGH (ref 70–99)
Potassium: 4 mmol/L (ref 3.5–5.1)
Sodium: 129 mmol/L — ABNORMAL LOW (ref 135–145)

## 2018-05-06 LAB — ECHOCARDIOGRAM COMPLETE
Height: 63 in
Weight: 2112.89 oz

## 2018-05-06 LAB — CREATININE, SERUM
Creatinine, Ser: 2.96 mg/dL — ABNORMAL HIGH (ref 0.44–1.00)
GFR calc Af Amer: 18 mL/min — ABNORMAL LOW (ref >60)
GFR calc non Af Amer: 15 mL/min — ABNORMAL LOW (ref >60)

## 2018-05-06 LAB — HEPARIN LEVEL (UNFRACTIONATED): Heparin Unfractionated: 0.35 IU/mL (ref 0.30–0.70)

## 2018-05-06 LAB — TROPONIN I: Troponin I: 11.65 ng/mL (ref <0.03)

## 2018-05-06 SURGERY — LEFT HEART CATH AND CORONARY ANGIOGRAPHY
Anesthesia: LOCAL

## 2018-05-06 MED ORDER — ONDANSETRON HCL 4 MG/2ML IJ SOLN
4.0000 mg | Freq: Four times a day (QID) | INTRAMUSCULAR | Status: DC | PRN
  Administered 2018-05-11 – 2018-05-14 (×2): 4 mg via INTRAVENOUS
  Filled 2018-05-06 (×3): qty 2

## 2018-05-06 MED ORDER — SODIUM CHLORIDE 0.9% FLUSH
3.0000 mL | INTRAVENOUS | Status: DC | PRN
  Administered 2018-05-12: 3 mL via INTRAVENOUS
  Filled 2018-05-06: qty 3

## 2018-05-06 MED ORDER — HEPARIN SODIUM (PORCINE) 5000 UNIT/ML IJ SOLN
5000.0000 [IU] | Freq: Three times a day (TID) | INTRAMUSCULAR | Status: DC
  Administered 2018-05-06 – 2018-05-07 (×2): 5000 [IU] via SUBCUTANEOUS
  Filled 2018-05-06 (×2): qty 1

## 2018-05-06 MED ORDER — CLOPIDOGREL BISULFATE 75 MG PO TABS
75.0000 mg | ORAL_TABLET | Freq: Every day | ORAL | Status: DC
  Administered 2018-05-07 – 2018-05-22 (×16): 75 mg via ORAL
  Filled 2018-05-06 (×19): qty 1

## 2018-05-06 MED ORDER — ACETAMINOPHEN 325 MG PO TABS
650.0000 mg | ORAL_TABLET | ORAL | Status: DC | PRN
  Filled 2018-05-06: qty 2

## 2018-05-06 MED ORDER — HEPARIN (PORCINE) IN NACL 1000-0.9 UT/500ML-% IV SOLN
INTRAVENOUS | Status: DC | PRN
  Administered 2018-05-06 (×2): 500 mL

## 2018-05-06 MED ORDER — TRAMADOL HCL 50 MG PO TABS: 50.0000 mg | ORAL_TABLET | Freq: Once | ORAL | Status: DC

## 2018-05-06 MED ORDER — HEPARIN (PORCINE) IN NACL 1000-0.9 UT/500ML-% IV SOLN
INTRAVENOUS | Status: AC
  Filled 2018-05-06: qty 1000

## 2018-05-06 MED ORDER — SODIUM CHLORIDE 0.9 % IV SOLN: 250.0000 mL | INTRAVENOUS | Status: DC | PRN

## 2018-05-06 MED ORDER — SODIUM CHLORIDE 0.9% FLUSH
3.0000 mL | Freq: Two times a day (BID) | INTRAVENOUS | Status: DC
  Administered 2018-05-07 – 2018-05-22 (×26): 3 mL via INTRAVENOUS

## 2018-05-06 MED ORDER — FENTANYL CITRATE (PF) 100 MCG/2ML IJ SOLN
INTRAMUSCULAR | Status: AC
  Filled 2018-05-06: qty 2

## 2018-05-06 MED ORDER — FENTANYL CITRATE (PF) 100 MCG/2ML IJ SOLN
INTRAMUSCULAR | Status: DC | PRN
  Administered 2018-05-06: 25 ug via INTRAVENOUS

## 2018-05-06 MED ORDER — CLOPIDOGREL BISULFATE 300 MG PO TABS
300.0000 mg | ORAL_TABLET | Freq: Once | ORAL | Status: AC
  Administered 2018-05-06: 300 mg via ORAL
  Filled 2018-05-06: qty 1

## 2018-05-06 MED ORDER — LIDOCAINE HCL (PF) 1 % IJ SOLN
INTRAMUSCULAR | Status: AC
  Filled 2018-05-06: qty 30

## 2018-05-06 MED ORDER — LIDOCAINE HCL (PF) 1 % IJ SOLN
INTRAMUSCULAR | Status: DC | PRN
  Administered 2018-05-06: 15 mL

## 2018-05-06 MED ORDER — SODIUM CHLORIDE 0.9 % IV SOLN
INTRAVENOUS | Status: AC
  Administered 2018-05-06: 20:00:00 via INTRAVENOUS

## 2018-05-06 MED ORDER — MIDAZOLAM HCL 2 MG/2ML IJ SOLN
INTRAMUSCULAR | Status: AC
  Filled 2018-05-06: qty 2

## 2018-05-06 MED ORDER — IOHEXOL 350 MG/ML SOLN
INTRAVENOUS | Status: DC | PRN
  Administered 2018-05-06: 75 mL via INTRA_ARTERIAL

## 2018-05-06 SURGICAL SUPPLY — 11 items
CATH INFINITI 5 FR 3DRC (CATHETERS) ×1 IMPLANT
CATH INFINITI 5FR MULTPACK ANG (CATHETERS) ×1 IMPLANT
CLOSURE MYNX CONTROL 5F (Vascular Products) ×1 IMPLANT
KIT HEART LEFT (KITS) ×3 IMPLANT
KIT MICROPUNCTURE NIT STIFF (SHEATH) ×1 IMPLANT
PACK CARDIAC CATHETERIZATION (CUSTOM PROCEDURE TRAY) ×3 IMPLANT
SHEATH PINNACLE 5F 10CM (SHEATH) ×1 IMPLANT
SHEATH PROBE COVER 6X72 (BAG) ×1 IMPLANT
TRANSDUCER W/STOPCOCK (MISCELLANEOUS) ×3 IMPLANT
TUBING CIL FLEX 10 FLL-RA (TUBING) ×3 IMPLANT
WIRE EMERALD 3MM-J .035X150CM (WIRE) ×1 IMPLANT

## 2018-05-06 NOTE — Progress Notes (Signed)
CRITICAL VALUE ALERT  Critical Value:  Troponin 11.65  Date & Time Notied:  05/14/2018 0400  Provider Notified: Cards Radford Pax)  Orders Received/Actions taken: No new orders at this time. RN will continue to monitor pt closely

## 2018-05-06 NOTE — Plan of Care (Signed)
  Problem: Education: Goal: Understanding of cardiac disease, CV risk reduction, and recovery process will improve Outcome: Progressing   Problem: Activity: Goal: Ability to tolerate increased activity will improve Outcome: Progressing   Problem: Education: Goal: Knowledge of General Education information will improve Description Including pain rating scale, medication(s)/side effects and non-pharmacologic comfort measures Outcome: Progressing   Problem: Clinical Measurements: Goal: Ability to maintain clinical measurements within normal limits will improve Outcome: Progressing

## 2018-05-06 NOTE — Progress Notes (Signed)
Progress Note  Patient Name: Brandy Salazar Date of Encounter: 05/20/2018  Primary Cardiologist: No primary care provider on file.   Subjective   Continues to complain of intermittent chest discomfort.  Describes this as a central pressure that feels similar to the pain she felt at the time of her MI.  Denies shortness of breath.  Inpatient Medications    Scheduled Meds: . amiodarone  200 mg Oral BID  . aspirin  81 mg Oral Daily  . aspirin  81 mg Oral Pre-Cath  . atorvastatin  40 mg Oral q1800  . pneumococcal 23 valent vaccine  0.5 mL Intramuscular Tomorrow-1000  . sodium chloride flush  3 mL Intravenous Q12H   Continuous Infusions: . sodium chloride Stopped (05/05/18 1619)  . sodium chloride    . sodium chloride 10 mL/hr at 05/31/2018 0800  . heparin 800 Units/hr (05/30/2018 0800)  . norepinephrine (LEVOPHED) Adult infusion Stopped (05/04/18 2103)   PRN Meds: sodium chloride, menthol-cetylpyridinium, sodium chloride flush, traMADol   Vital Signs    Vitals:   05/07/2018 0600 05/14/2018 0700 05/05/2018 0800 05/16/2018 0840  BP: 116/72 122/78 123/83   Pulse: 91 97 96   Resp: (!) 25 (!) 32 (!) 26   Temp:    98.6 F (37 C)  TempSrc:    Oral  SpO2: 98% 98% 98%   Weight:      Height:        Intake/Output Summary (Last 24 hours) at 05/04/2018 0853 Last data filed at 05/03/2018 0800 Gross per 24 hour  Intake 904.08 ml  Output 2400 ml  Net -1495.92 ml   Filed Weights   05/22/2018 2131 05/03/18 0030 05/05/18 0500  Weight: 59.9 kg 57.2 kg 59.9 kg    Telemetry    Normal sinus rhythm without significant arrhythmia, few PACs - Personally Reviewed  ECG    Normal sinus rhythm with anterolateral infarct, inferior infarct, age acute, no significant changes from previous tracings - Personally Reviewed  Physical Exam  Alert, oriented, elderly woman in no distress GEN: No acute distress.   Neck: No JVD Cardiac: RRR, harsh 3/6 holo-systolic murmur at the apex Respiratory: Clear to  auscultation bilaterally. GI: Soft, nontender, non-distended  MS: No edema; No deformity. Neuro:  Nonfocal  Psych: Normal affect   Labs    Chemistry Recent Labs  Lab 05/03/18 0605 05/03/18 0938 05/04/18 0339 05/05/18 0216 05/04/2018 0122  NA 115* 134* 131* 128* 129*  K 3.6 3.9 3.3* 3.8 4.0  CL 81* 96* 94* 94* 97*  CO2 17* 19* 22 21* 22  GLUCOSE 744* 123* 127* 141* 128*  BUN 73* 89* 105* 97* 80*  CREATININE 4.86* 5.64* 4.68* 3.65* 2.87*  CALCIUM 7.4* 8.3* 8.1* 8.3* 8.6*  PROT 5.3* 6.7  --  5.7*  --   ALBUMIN 2.4* 2.8* 2.6* 2.4*  --   AST 180* 190*  --  55*  --   ALT 97* 116*  --  79*  --   ALKPHOS 55 65  --  57  --   BILITOT 0.5 0.4  --  0.2*  --   GFRNONAA 8* 7* 9* 12* 16*  GFRAA 10* 8* 10* 14* 18*  ANIONGAP 17* 19* 15 13 10      Hematology Recent Labs  Lab 05/04/18 0339 05/05/18 0216 05/22/2018 0122  WBC 18.2* 15.0* 14.0*  RBC 3.21* 3.04* 3.07*  HGB 10.5* 9.5* 9.7*  HCT 30.3* 28.2* 29.0*  MCV 94.4 92.8 94.5  MCH 32.7 31.3 31.6  MCHC  34.7 33.7 33.4  RDW 13.1 12.8 13.0  PLT 192 185 206    Cardiac Enzymes Recent Labs  Lab 05/03/18 0605 05/03/18 0938 05/03/18 1704 05/12/2018 0122  TROPONINI 59.14* 58.78* 32.89* 11.65*    Recent Labs  Lab 05/14/2018 2200  TROPIPOC >30.00*     BNPNo results for input(s): BNP, PROBNP in the last 168 hours.   DDimer No results for input(s): DDIMER in the last 168 hours.   Radiology    No results found.  Cardiac Studies   2D echocardiogram: Study Conclusions  - Left ventricle: The cavity size was normal. Wall thickness was   increased in a pattern of severe LVH. Systolic function was   mildly to moderately reduced. The estimated ejection fraction was   in the range of 40% to 45%. There is akinesis of the   mid-apicalanteroseptal and apical myocardium. The study is not   technically sufficient to allow evaluation of LV diastolic   function. - Left atrium: The atrium was mildly dilated. - Right ventricle: Systolic  function was severely reduced. - Right atrium: The atrium was mildly dilated. - Tricuspid valve: There was moderate regurgitation. - Pulmonary arteries: Systolic pressure was mildly increased. PA   peak pressure: 41 mm Hg (S).  Impressions:  - Limited study; full doppler not performed; akinesis of the distal   anteroseptal wall and apex with overall EF 40; severe LVH;   biatrial enlargement; severe RV dysfunction; moderate TR; mild   pulmonary hypertension.  Patient Profile     73 y.o. female admitted 05/20/2018 with a late presenting anterolateral MI, and acute on chronic kidney injury  Assessment & Plan    1.  Acute anterolateral MI: Patient continues to have stuttering chest discomfort.  I agree that cardiac catheterization and possible PCI are indicated. I have reviewed the risks, indications, and alternatives to cardiac catheterization, possible angioplasty, and stenting with the patient. Risks include but are not limited to bleeding, infection, vascular injury, stroke, myocardial infection, arrhythmia, kidney injury, radiation-related injury in the case of prolonged fluoroscopy use, emergency cardiac surgery, and death. The patient understands the risks of serious complication is 1-2 in 6237 with diagnostic cardiac cath and 1-2% or less with angioplasty/stenting.  The patient's daughter is at the bedside.  They both understand the increased risk of progressive kidney failure requiring dialysis that could be related to contrast administration with cardiac catheterization.  Fortunately, her creatinine is trending downward from a peak of 5.64 mg/dL to today's value of 2.87 mg/dL.  I think she is medically optimized to proceed.  Depending on the findings, she may require staged intervention.  The patient is currently on aspirin, heparin, and a high intensity statin drug.  Will add a low-dose beta-blocker as blood pressure tolerates.  2.  Acute on chronic kidney injury: As above.  Appreciate  nephrology follow-up.  No ACE/ARB in the setting of advanced kidney disease.  3.  Atrial fibrillation/flutter: The patient is currently on oral amiodarone, converted from IV to oral amiodarone yesterday.  She continues on heparin.  We will need to make a decision on oral anticoagulation based on findings at cardiac catheterization.      For questions or updates, please contact [place] Please consult [email or web address] for contact info under        Signed, Sherren Mocha, MD  05/17/2018, 8:53 AM

## 2018-05-06 NOTE — Interval H&P Note (Signed)
History and Physical Interval Note:  05/26/2018 4:48 PM  Brandy Salazar  has presented today for surgery, with the diagnosis of NSTEMI. -  Likely late presentation of Anterolateral MI with post-MI angina..   The various methods of treatment have been discussed with the patient and family. After consideration of risks, benefits and other options for treatment, the patient has consented to  Procedure(s): LEFT HEART CATH AND CORONARY ANGIOGRAPHY (N/A) as a surgical intervention .  The patient's history has been reviewed, patient examined, no change in status, stable for surgery.  I have reviewed the patient's chart and labs.  Questions were answered to the patient's satisfaction.     Cath Lab Visit (complete for each Cath Lab visit)  Clinical Evaluation Leading to the Procedure:   ACS: Yes.    Non-ACS:    Anginal Classification: CCS IV  Anti-ischemic medical therapy: Minimal Therapy (1 class of medications)  Non-Invasive Test Results: No non-invasive testing performed  Prior CABG: No previous CABG    Glenetta Hew

## 2018-05-06 NOTE — Progress Notes (Signed)
[place] for heparin Indication: chest pain/ACS  No Known Allergies  Patient Measurements: Height: 5\' 3"  (160 cm) Weight: 132 lb 0.9 oz (59.9 kg) IBW/kg (Calculated) : 52.4 Heparin Dosing Weight: 60kg  Vital Signs: Temp: 98 F (36.7 C) (01/06 1120) Temp Source: Oral (01/06 1120) BP: 128/86 (01/06 1200) Pulse Rate: 97 (01/06 1200)  Labs: Recent Labs    05/03/18 1704  05/04/18 0339 05/05/18 0216 05/05/18 1916 05/05/2018 0122  HGB  --    < > 10.5* 9.5*  --  9.7*  HCT  --   --  30.3* 28.2*  --  29.0*  PLT  --   --  192 185  --  206  HEPARINUNFRC 0.67  --  0.55 0.26* 0.31 0.35  CREATININE  --   --  4.68* 3.65*  --  2.87*  TROPONINI 32.89*  --   --   --   --  11.65*   < > = values in this interval not displayed.    Estimated Creatinine Clearance: 14.7 mL/min (A) (by C-G formula based on SCr of 2.87 mg/dL (H)).   Medical History: Past Medical History:  Diagnosis Date  . Hypertension   . Renal disorder    Assessment: 73 year old female presented to [facility] with weakness, ST elevations noted on EKG. Unfortunately after interview it would seem that patient's heart attack was several days ago. On admission patient's SCr was 5.1. Cath scheduled for 05/21/2018  Patients heparin level is therapeutic this AM at 0.35. No signs or symptoms of bleeding noted. Patient's CBC remains stable  Goal of Therapy:  Heparin level 0.3-0.7 units/ml Monitor platelets by anticoagulation protocol: Yes   Plan:  Continue IV heparin at 800 units/hr Monitor anti-Xa level daily while on heparin Continue to monitor H&H, platelets, and for s/sx of bleeding F/u plans for oral anticoagulation  Thank you for allowing pharmacy to be a part of this patient's care.  Leron Croak, PharmD PGY1 Pharmacy Resident Phone: [phone]  Please check AMION for all [place] phone numbers  05/09/2018 12:46 PM

## 2018-05-06 NOTE — Progress Notes (Signed)
[facility]    NEPHROLOGY PROGRESS NOTE  SUBJECTIVE: Reports no acute complaints.  Denies chest pain currently but does have intermittent chest pain symptoms.  She denies shortness of breath, nausea, vomiting, diarrhea or dysuria.  Urine output has been reasonable.  All other review systems are negative.   OBJECTIVE:  Vitals:   05/08/2018 0840 05/22/2018 0900  BP:  113/75  Pulse:  95  Resp:  (!) 30  Temp: 98.6 F (37 C)   SpO2:  98%   I/O last 3 completed shifts: In: 1502 [P.O.:480; I.V.:1022] Out: 3100 [Urine:3100]   Gen[place]l:  AAOx3 NAD HEENT: MMM [place] AT anicteric sclera Neck:  No JVD, no adenopathy CV:  Heart RRR with 3 out of 6 systolic murmur Lungs:  L/S CTA bilaterally Abd:  abd SNT/ND with normal BS GU:  Bladder non-palpable Extremities:  No LE edema. Skin:  No skin rash Neurologic: Nonfocal exam  MEDICATIONS:   Current Facility-Administered Medications:  .  0.9 %  sodium chloride infusion, , Intravenous, Continuous, Sherwood Gambler, MD, Stopped at 05/05/18 1619 .  0.9 %  sodium chloride infusion, 250 mL, Intravenous, PRN, Stanford Breed, Denice Bors, MD .  0.9 %  sodium chloride infusion, , Intravenous, Continuous, [place]ren[place], Denice Bors, MD, Last Rate: 10 mL/hr at 05/15/2018 0900 .  amiodarone (PACERONE) tablet 200 mg, 200 mg, Oral, BID, Lelon Perla, MD, 200 mg at 05/05/18 2119 .  aspirin chewable tablet 81 mg, 81 mg, Oral, Daily, Troy Sine, MD, 81 mg at 05/05/18 1026 .  aspirin chewable tablet 81 mg, 81 mg, Oral, Pre-Cath, Crenshaw, Denice Bors, MD .  atorvastatin (LIPITOR) tablet 40 mg, 40 mg, Oral, q1800, Lelon Perla, MD, 40 mg at 05/05/18 1749 .  heparin ADULT infusion 100 units/mL (25000 units/274mL sodium chloride 0.45%), 800 Units/hr, Intravenous, Continuous, [place], Royal Hawthorn, MD, Last Rate: 8 mL/hr at 05/03/2018 0900, 800 Units/hr at 05/28/2018 0900 .  menthol-cetylpyridinium (CEPACOL) lozenge 3 mg, 1 lozenge, Oral, PRN, Stanford Breed, Denice Bors, MD .   norepinephrine (LEVOPHED) 4 mg in dextrose 5 % 250 mL (0.016 mg/mL) infusion, 0-40 mcg/min, Intravenous, Continuous, Malvin Johns, MD, Stopped at 05/04/18 2103 .  pneumococcal 23 valent vaccine (PNU-IMMUNE) injection 0.5 mL, 0.5 mL, Intramuscular, Tomorrow-1000, Crenshaw, Denice Bors, MD .  sodium chloride flush (NS) 0.9 % injection 3 mL, 3 mL, Intravenous, Q12H, Lelon Perla, MD, 3 mL at 05/05/18 2121 .  sodium chloride flush (NS) 0.9 % injection 3 mL, 3 mL, Intravenous, PRN, Stanford Breed, Denice Bors, MD .  traMADol Veatrice Bourbon) tablet 50 mg, 50 mg, Oral, Q12H PRN, Daune Perch, NP, 50 mg at 05/05/18 1956     LABS:  CBC Latest Ref Rng & Units 05/05/2018 05/05/2018 05/04/2018  WBC 4.0 - 10.5 K/uL 14.0(H) 15.0(H) 18.2(H)  Hemoglobin 12.0 - 15.0 g/dL 9.7(L) 9.5(L) 10.5(L)  Hematocrit 36.0 - 46.0 % 29.0(L) 28.2(L) 30.3(L)  Platelets 150 - 400 K/uL 206 185 192    CMP Latest Ref Rng & Units 05/30/2018 05/05/2018 05/04/2018  Glucose 70 - 99 mg/dL 128(H) 141(H) 127(H)  BUN 8 - 23 mg/dL 80(H) 97(H) 105(H)  Creatinine 0.44 - 1.00 mg/dL 2.87(H) 3.65(H) 4.68(H)  Sodium 135 - 145 mmol/L 129(L) 128(L) 131(L)  Potassium 3.5 - 5.1 mmol/L 4.0 3.8 3.3(L)  Chloride 98 - 111 mmol/L 97(L) 94(L) 94(L)  CO2 22 - 32 mmol/L 22 21(L) 22  Calcium 8.9 - 10.3 mg/dL 8.6(L) 8.3(L) 8.1(L)  Total Protein 6.5 - 8.1 g/dL - 5.7(L) -  Total Bilirubin  0.3 - 1.2 mg/dL - 0.2(L) -  Alkaline Phos 38 - 126 U/L - 57 -  AST 15 - 41 U/L - 55(H) -  ALT 0 - 44 U/L - 79(H) -    Lab Results  Component Value Date   CALCIUM 8.6 (L) 05/03/2018   CAION 1.05 (L) 05/14/2018   PHOS 3.1 05/04/2018    No results found for: COLORURINE, APPEARANCEUR, LABSPEC, PHURINE, GLUCOSEU, HGBUR, BILIRUBINUR, KETONESUR, PROTEINUR, UROBILINOGEN, NITRITE, LEUKOCYTESUR    Component Value Date/Time   TCO2 20 (L) 05/03/2018 2202    No results found for: IRON, TIBC, FERRITIN, IRONPCTSAT     ASSESSMENT/PLAN:     Problem List Items Addressed This Visit       Cardiovascular and Mediastinum   ST elevation (STEMI) myocardial infarction ([place]) - Primary   Relevant Medications   aspirin chewable tablet 324 mg (Completed)   heparin injection 3,600 Units (Completed)   heparin ADULT infusion 100 units/mL (25000 units/227mL sodium chloride 0.45%)   cloNIDine (CATAPRES) 0.1 MG tablet   metoprolol succinate (TOPROL-XL) 100 MG 24 hr tablet   verapamil (CALAN-SR) 240 MG CR tablet   hydrochlorothiazide (HYDRODIURIL) 25 MG tablet   lisinopril (PRINIVIL,ZESTRIL) 40 MG tablet   norepinephrine (LEVOPHED) 4 mg in dextrose 5 % 250 mL (0.016 mg/mL) infusion   aspirin chewable tablet 81 mg (Completed)   aspirin chewable tablet 81 mg   amiodarone (NEXTERONE) 1.8 mg/mL load via infusion 150 mg (Completed)   atorvastatin (LIPITOR) tablet 40 mg   aspirin chewable tablet 81 mg   amiodarone (PACERONE) tablet 200 mg   Cardiogenic shock (HCC)   Relevant Medications   aspirin chewable tablet 324 mg (Completed)   heparin injection 3,600 Units (Completed)   heparin ADULT infusion 100 units/mL (25000 units/272mL sodium chloride 0.45%)   cloNIDine (CATAPRES) 0.1 MG tablet   metoprolol succinate (TOPROL-XL) 100 MG 24 hr tablet   verapamil (CALAN-SR) 240 MG CR tablet   hydrochlorothiazide (HYDRODIURIL) 25 MG tablet   lisinopril (PRINIVIL,ZESTRIL) 40 MG tablet   norepinephrine (LEVOPHED) 4 mg in dextrose 5 % 250 mL (0.016 mg/mL) infusion   aspirin chewable tablet 81 mg (Completed)   aspirin chewable tablet 81 mg   amiodarone (NEXTERONE) 1.8 mg/mL load via infusion 150 mg (Completed)   atorvastatin (LIPITOR) tablet 40 mg   aspirin chewable tablet 81 mg   amiodarone (PACERONE) tablet 200 mg    Other Visit Diagnoses    Pulmonary edema       Relevant Orders   DG CHEST PORT 1 VIEW (Completed)   MI (myocardial infarction) (HCC)       Relevant Medications   aspirin chewable tablet 324 mg (Completed)   heparin injection 3,600 Units (Completed)   heparin ADULT infusion 100  units/mL (25000 units/251mL sodium chloride 0.45%)   cloNIDine (CATAPRES) 0.1 MG tablet   metoprolol succinate (TOPROL-XL) 100 MG 24 hr tablet   verapamil (CALAN-SR) 240 MG CR tablet   hydrochlorothiazide (HYDRODIURIL) 25 MG tablet   lisinopril (PRINIVIL,ZESTRIL) 40 MG tablet   norepinephrine (LEVOPHED) 4 mg in dextrose 5 % 250 mL (0.016 mg/mL) infusion   aspirin chewable tablet 81 mg (Completed)   aspirin chewable tablet 81 mg   amiodarone (NEXTERONE) 1.8 mg/mL load via infusion 150 mg (Completed)   atorvastatin (LIPITOR) tablet 40 mg   aspirin chewable tablet 81 mg   amiodarone (PACERONE) tablet 200 mg     1.  Acute kidney injury.  Creatinine on admission around 5.  Improved to 2.87 today.  Urine output has improved.  No urgent indication for dialysis.  Plans for cardiac cathterization noted.  We will continue to follow closely.  Hold lisinopril.  2.  Hyponatremia.  Avoid hypotonic fluids..  Likely secondary to severe kidney injury and decreased effective circulatory volume setting of heart failure.  3.  Acute anterolateral MI.  For cardiac catheterization tomorrow. 2D echocardiogram revealed: Limited study; full doppler not performed; akinesis of the distal   anteroseptal wall and apex with overall EF 40; severe LVH;   biatrial enlargement; severe RV dysfunction; moderate TR; mild   pulmonary hypertension.  4.  Anemia.  Stable hemoglobin.  Check iron panel.    [place], DO, [place]

## 2018-05-06 NOTE — Progress Notes (Signed)
  Echocardiogram 2D Echocardiogram has been performed.  Brandy Salazar 05/21/2018, 8:35 AM

## 2018-05-06 NOTE — Progress Notes (Signed)
Patient transferred to Cath Lab room 3 with receiving team at bedside.

## 2018-05-06 NOTE — H&P (View-Only) (Signed)
Progress Note  Patient Name: Brandy Salazar Date of Encounter: 05/16/2018  Primary Cardiologist: No primary care provider on file.   Subjective   Continues to complain of intermittent chest discomfort.  Describes this as a central pressure that feels similar to the pain she felt at the time of her MI.  Denies shortness of breath.  Inpatient Medications    Scheduled Meds: . amiodarone  200 mg Oral BID  . aspirin  81 mg Oral Daily  . aspirin  81 mg Oral Pre-Cath  . atorvastatin  40 mg Oral q1800  . pneumococcal 23 valent vaccine  0.5 mL Intramuscular Tomorrow-1000  . sodium chloride flush  3 mL Intravenous Q12H   Continuous Infusions: . sodium chloride Stopped (05/05/18 1619)  . sodium chloride    . sodium chloride 10 mL/hr at 05/10/2018 0800  . heparin 800 Units/hr (05/05/2018 0800)  . norepinephrine (LEVOPHED) Adult infusion Stopped (05/04/18 2103)   PRN Meds: sodium chloride, menthol-cetylpyridinium, sodium chloride flush, traMADol   Vital Signs    Vitals:   05/22/2018 0600 05/24/2018 0700 05/10/2018 0800 05/15/2018 0840  BP: 116/72 122/78 123/83   Pulse: 91 97 96   Resp: (!) 25 (!) 32 (!) 26   Temp:    98.6 F (37 C)  TempSrc:    Oral  SpO2: 98% 98% 98%   Weight:      Height:        Intake/Output Summary (Last 24 hours) at 05/21/2018 0853 Last data filed at 05/30/2018 0800 Gross per 24 hour  Intake 904.08 ml  Output 2400 ml  Net -1495.92 ml   Filed Weights   05/03/2018 2131 05/03/18 0030 05/05/18 0500  Weight: 59.9 kg 57.2 kg 59.9 kg    Telemetry    Normal sinus rhythm without significant arrhythmia, few PACs - Personally Reviewed  ECG    Normal sinus rhythm with anterolateral infarct, inferior infarct, age acute, no significant changes from previous tracings - Personally Reviewed  Physical Exam  Alert, oriented, elderly woman in no distress GEN: No acute distress.   Neck: No JVD Cardiac: RRR, harsh 3/6 holo-systolic murmur at the apex Respiratory: Clear to  auscultation bilaterally. GI: Soft, nontender, non-distended  MS: No edema; No deformity. Neuro:  Nonfocal  Psych: Normal affect   Labs    Chemistry Recent Labs  Lab 05/03/18 0605 05/03/18 0938 05/04/18 0339 05/05/18 0216 05/26/2018 0122  NA 115* 134* 131* 128* 129*  K 3.6 3.9 3.3* 3.8 4.0  CL 81* 96* 94* 94* 97*  CO2 17* 19* 22 21* 22  GLUCOSE 744* 123* 127* 141* 128*  BUN 73* 89* 105* 97* 80*  CREATININE 4.86* 5.64* 4.68* 3.65* 2.87*  CALCIUM 7.4* 8.3* 8.1* 8.3* 8.6*  PROT 5.3* 6.7  --  5.7*  --   ALBUMIN 2.4* 2.8* 2.6* 2.4*  --   AST 180* 190*  --  55*  --   ALT 97* 116*  --  79*  --   ALKPHOS 55 65  --  57  --   BILITOT 0.5 0.4  --  0.2*  --   GFRNONAA 8* 7* 9* 12* 16*  GFRAA 10* 8* 10* 14* 18*  ANIONGAP 17* 19* 15 13 10      Hematology Recent Labs  Lab 05/04/18 0339 05/05/18 0216 05/25/2018 0122  WBC 18.2* 15.0* 14.0*  RBC 3.21* 3.04* 3.07*  HGB 10.5* 9.5* 9.7*  HCT 30.3* 28.2* 29.0*  MCV 94.4 92.8 94.5  MCH 32.7 31.3 31.6  MCHC  34.7 33.7 33.4  RDW 13.1 12.8 13.0  PLT 192 185 206    Cardiac Enzymes Recent Labs  Lab 05/03/18 0605 05/03/18 0938 05/03/18 1704 05/19/2018 0122  TROPONINI 59.14* 58.78* 32.89* 11.65*    Recent Labs  Lab 05/28/2018 2200  TROPIPOC >30.00*     BNPNo results for input(s): BNP, PROBNP in the last 168 hours.   DDimer No results for input(s): DDIMER in the last 168 hours.   Radiology    No results found.  Cardiac Studies   2D echocardiogram: Study Conclusions  - Left ventricle: The cavity size was normal. Wall thickness was   increased in a pattern of severe LVH. Systolic function was   mildly to moderately reduced. The estimated ejection fraction was   in the range of 40% to 45%. There is akinesis of the   mid-apicalanteroseptal and apical myocardium. The study is not   technically sufficient to allow evaluation of LV diastolic   function. - Left atrium: The atrium was mildly dilated. - Right ventricle: Systolic  function was severely reduced. - Right atrium: The atrium was mildly dilated. - Tricuspid valve: There was moderate regurgitation. - Pulmonary arteries: Systolic pressure was mildly increased. PA   peak pressure: 41 mm Hg (S).  Impressions:  - Limited study; full doppler not performed; akinesis of the distal   anteroseptal wall and apex with overall EF 40; severe LVH;   biatrial enlargement; severe RV dysfunction; moderate TR; mild   pulmonary hypertension.  Patient Profile     73 y.o. female admitted 05/20/2018 with a late presenting anterolateral MI, and acute on chronic kidney injury  Assessment & Plan    1.  Acute anterolateral MI: Patient continues to have stuttering chest discomfort.  I agree that cardiac catheterization and possible PCI are indicated. I have reviewed the risks, indications, and alternatives to cardiac catheterization, possible angioplasty, and stenting with the patient. Risks include but are not limited to bleeding, infection, vascular injury, stroke, myocardial infection, arrhythmia, kidney injury, radiation-related injury in the case of prolonged fluoroscopy use, emergency cardiac surgery, and death. The patient understands the risks of serious complication is 1-2 in 6378 with diagnostic cardiac cath and 1-2% or less with angioplasty/stenting.  The patient's daughter is at the bedside.  They both understand the increased risk of progressive kidney failure requiring dialysis that could be related to contrast administration with cardiac catheterization.  Fortunately, her creatinine is trending downward from a peak of 5.64 mg/dL to today's value of 2.87 mg/dL.  I think she is medically optimized to proceed.  Depending on the findings, she may require staged intervention.  The patient is currently on aspirin, heparin, and a high intensity statin drug.  Will add a low-dose beta-blocker as blood pressure tolerates.  2.  Acute on chronic kidney injury: As above.  Appreciate  nephrology follow-up.  No ACE/ARB in the setting of advanced kidney disease.  3.  Atrial fibrillation/flutter: The patient is currently on oral amiodarone, converted from IV to oral amiodarone yesterday.  She continues on heparin.  We will need to make a decision on oral anticoagulation based on findings at cardiac catheterization.      For questions or updates, please contact [place] Please consult [email or web address] for contact info under        Signed, Sherren Mocha, MD  05/17/2018, 8:53 AM

## 2018-05-07 ENCOUNTER — Other Ambulatory Visit: Payer: Self-pay

## 2018-05-07 ENCOUNTER — Encounter (HOSPITAL_COMMUNITY): Payer: Self-pay | Admitting: Cardiology

## 2018-05-07 DIAGNOSIS — I255 Ischemic cardiomyopathy: Secondary | ICD-10-CM

## 2018-05-07 DIAGNOSIS — I2119 ST elevation (STEMI) myocardial infarction involving other coronary artery of inferior wall: Secondary | ICD-10-CM

## 2018-05-07 DIAGNOSIS — I4891 Unspecified atrial fibrillation: Secondary | ICD-10-CM

## 2018-05-07 LAB — CBC
HCT: 27.3 % — ABNORMAL LOW (ref 36.0–46.0)
HEMOGLOBIN: 9.3 g/dL — AB (ref 12.0–15.0)
MCH: 32.5 pg (ref 26.0–34.0)
MCHC: 34.1 g/dL (ref 30.0–36.0)
MCV: 95.5 fL (ref 80.0–100.0)
Platelets: 243 10*3/uL (ref 150–400)
RBC: 2.86 MIL/uL — ABNORMAL LOW (ref 3.87–5.11)
RDW: 13.2 % (ref 11.5–15.5)
WBC: 17.3 10*3/uL — ABNORMAL HIGH (ref 4.0–10.5)
nRBC: 0.9 % — ABNORMAL HIGH (ref 0.0–0.2)

## 2018-05-07 LAB — BASIC METABOLIC PANEL
Anion gap: 16 — ABNORMAL HIGH (ref 5–15)
BUN: 71 mg/dL — ABNORMAL HIGH (ref 8–23)
CO2: 19 mmol/L — ABNORMAL LOW (ref 22–32)
Calcium: 9 mg/dL (ref 8.9–10.3)
Chloride: 98 mmol/L (ref 98–111)
Creatinine, Ser: 3.02 mg/dL — ABNORMAL HIGH (ref 0.44–1.00)
GFR calc Af Amer: 17 mL/min — ABNORMAL LOW (ref >60)
GFR calc non Af Amer: 15 mL/min — ABNORMAL LOW (ref >60)
Glucose, Bld: 175 mg/dL — ABNORMAL HIGH (ref 70–99)
Potassium: 4.5 mmol/L (ref 3.5–5.1)
Sodium: 133 mmol/L — ABNORMAL LOW (ref 135–145)

## 2018-05-07 LAB — IRON AND TIBC
Iron: 8 ug/dL — ABNORMAL LOW (ref 28–170)
Saturation Ratios: 3 % — ABNORMAL LOW (ref 10.4–31.8)
TIBC: 239 ug/dL — ABNORMAL LOW (ref 250–450)
UIBC: 231 ug/dL

## 2018-05-07 LAB — FERRITIN: Ferritin: 137 ng/mL (ref 11–307)

## 2018-05-07 LAB — HEPARIN LEVEL (UNFRACTIONATED): Heparin Unfractionated: 0.34 IU/mL (ref 0.30–0.70)

## 2018-05-07 MED ORDER — AMIODARONE HCL IN DEXTROSE 360-4.14 MG/200ML-% IV SOLN
30.0000 mg/h | INTRAVENOUS | Status: DC
  Administered 2018-05-07 – 2018-05-08 (×2): 30 mg/h via INTRAVENOUS
  Filled 2018-05-07 (×2): qty 200

## 2018-05-07 MED ORDER — AMIODARONE HCL IN DEXTROSE 360-4.14 MG/200ML-% IV SOLN
INTRAVENOUS | Status: AC
  Filled 2018-05-07: qty 200

## 2018-05-07 MED ORDER — AMIODARONE IV BOLUS ONLY 150 MG/100ML
150.0000 mg | Freq: Once | INTRAVENOUS | Status: AC
  Administered 2018-05-07: 150 mg via INTRAVENOUS

## 2018-05-07 MED ORDER — HEPARIN (PORCINE) 25000 UT/250ML-% IV SOLN
800.0000 [IU]/h | INTRAVENOUS | Status: DC
  Administered 2018-05-07 – 2018-05-20 (×9): 800 [IU]/h via INTRAVENOUS
  Filled 2018-05-07 (×12): qty 250

## 2018-05-07 MED ORDER — AMIODARONE HCL IN DEXTROSE 360-4.14 MG/200ML-% IV SOLN
60.0000 mg/h | INTRAVENOUS | Status: AC
  Administered 2018-05-07 (×2): 60 mg/h via INTRAVENOUS
  Filled 2018-05-07: qty 200

## 2018-05-07 MED FILL — Midazolam HCl Inj 2 MG/2ML (Base Equivalent): INTRAMUSCULAR | Qty: 2 | Status: AC

## 2018-05-07 NOTE — Progress Notes (Signed)
ANTICOAGULATION CONSULT NOTE   Pharmacy Consult for heparin Indication: chest pain/ACS  No Known Allergies  Patient Measurements: Height: 5\' 3"  (160 cm) Weight: 132 lb 0.9 oz (59.9 kg) IBW/kg (Calculated) : 52.4 Heparin Dosing Weight: 60kg  Vital Signs: Temp: 98.2 F (36.8 C) (01/07 1531) Temp Source: Oral (01/07 1531) BP: 106/75 (01/07 1600) Pulse Rate: 81 (01/07 1700)  Labs: Recent Labs    05/05/18 1916 05/11/2018 0122 05/04/2018 1943 05/07/18 0333 05/07/18 0821 05/07/18 1626  HGB  --  9.7* 9.6* 9.3*  --   --   HCT  --  29.0* 28.7* 27.3*  --   --   PLT  --  206 220 243  --   --   HEPARINUNFRC 0.31 0.35  --   --   --  0.34  CREATININE  --  2.87* 2.96*  --  3.02*  --   TROPONINI  --  11.65*  --   --   --   --     Estimated Creatinine Clearance: 13.9 mL/min (A) (by C-G formula based on SCr of 3.02 mg/dL (H)).   Medical History: Past Medical History:  Diagnosis Date  . Hypertension   . Renal disorder    Assessment: 73 year old female [facility] with weakness, ST elevations noted on EKG. Unfortunately after interview it would seem that patient's heart attack was several days ago. Patient is s/p cath on 05/25/2018. Pharmacy has been consulted to restart heparin gtt for afib (patient in Afib RVR 05/07/18 AM).  Patient's CBC is stable this AM, with no overt bleeding or complications noted.  Initial heparin level is therapeutic at 0.34 on 800 units/hr.   Goal of Therapy:  Heparin level 0.3-0.7 units/ml Monitor platelets by anticoagulation protocol: Yes   Plan:  Continue IV heparin at 800 units/hr Monitor anti-Xa level daily while on heparin Continue to monitor H&H, platelets, and for s/sx of bleeding F/u plans for oral anticoagulation  Thank you for allowing pharmacy to be a part of this patient's care.  Sloan Leiter, PharmD, BCPS, BCCCP Clinical Pharmacist Clinical phone 05/07/2018 until 10:30PM - #[phone] Please check AMION for all [facility] phone  numbers  05/07/2018 6:02 PM

## 2018-05-07 NOTE — Plan of Care (Signed)
  Problem: Activity: Goal: Ability to tolerate increased activity will improve Outcome: Progressing   Problem: Cardiac: Goal: Vascular access site(s) Level 0-1 will be maintained Outcome: Progressing   Problem: Clinical Measurements: Goal: Ability to maintain clinical measurements within normal limits will improve Outcome: Progressing   Problem: Nutrition: Goal: Adequate nutrition will be maintained Outcome: Progressing   Problem: Coping: Goal: Level of anxiety will decrease Outcome: Progressing   Problem: Pain Managment: Goal: General experience of comfort will improve Outcome: Progressing   Problem: Safety: Goal: Ability to remain free from injury will improve Outcome: Progressing   Problem: Skin Integrity: Goal: Risk for impaired skin integrity will decrease Outcome: Progressing

## 2018-05-07 NOTE — Progress Notes (Signed)
Progress Note  Patient Name: Brandy Salazar Date of Encounter: 05/07/2018  Primary Cardiologist: No primary care provider on file.   Subjective   No chest pain this am. No dyspnea.   Inpatient Medications    Scheduled Meds: . amiodarone  200 mg Oral BID  . aspirin  81 mg Oral Daily  . atorvastatin  40 mg Oral q1800  . clopidogrel  75 mg Oral Q breakfast  . heparin  5,000 Units Subcutaneous Q8H  . pneumococcal 23 valent vaccine  0.5 mL Intramuscular Tomorrow-1000  . sodium chloride flush  3 mL Intravenous Q12H   Continuous Infusions: . sodium chloride Stopped (05/05/18 1619)  . sodium chloride    . norepinephrine (LEVOPHED) Adult infusion Stopped (05/04/18 2103)   PRN Meds: sodium chloride, acetaminophen, menthol-cetylpyridinium, ondansetron (ZOFRAN) IV, sodium chloride flush, traMADol   Vital Signs    Vitals:   05/07/18 0648 05/07/18 0700 05/07/18 0715 05/07/18 0730  BP: 102/67  91/80 (!) 84/64  Pulse: (!) 129 (!) 103 (!) 126 93  Resp: (!) 29 (!) 24 (!) 30 (!) 28  Temp:      TempSrc:      SpO2: 98% 99% 100% 100%  Weight:      Height:        Intake/Output Summary (Last 24 hours) at 05/07/2018 0818 Last data filed at 05/07/2018 0000 Gross per 24 hour  Intake 610.02 ml  Output 600 ml  Net 10.02 ml   Filed Weights   05/03/2018 2131 05/03/18 0030 05/05/18 0500  Weight: 59.9 kg 57.2 kg 59.9 kg    Telemetry     Atrial fib with RVR- Personally Reviewed  ECG    Atrial fib, ST elevation precordial leads/evolving MI- Personally Reviewed  Physical Exam   General: elderly female. NAD  HEENT: OP clear, mucus membranes moist  SKIN: warm, dry. No rashes. Neuro: No focal deficits  Musculoskeletal: Muscle strength 5/5 all ext  Psychiatric: Mood and affect normal  Neck: No JVD, no carotid bruits, no thyromegaly, no lymphadenopathy.  Lungs:Clear bilaterally, no wheezes, rhonci, crackles Cardiovascular: Irreg irreg. Systolic murmur.  Abdomen:Soft. Bowel sounds  present. Non-tender.  Extremities: No lower extremity edema. Pulses are 2 + in the bilateral DP/PT.    Labs    Chemistry Recent Labs  Lab 05/03/18 [phone] 05/03/18 0938 05/04/18 0339 05/05/18 0216 05/27/2018 0122 05/04/2018 1943  NA 115* 134* 131* 128* 129*  --   K 3.6 3.9 3.3* 3.8 4.0  --   CL 81* 96* 94* 94* 97*  --   CO2 17* 19* 22 21* 22  --   GLUCOSE 744* 123* 127* 141* 128*  --   BUN 73* 89* 105* 97* 80*  --   CREATININE 4.86* 5.64* 4.68* 3.65* 2.87* 2.96*  CALCIUM 7.4* 8.3* 8.1* 8.3* 8.6*  --   PROT 5.3* 6.7  --  5.7*  --   --   ALBUMIN 2.4* 2.8* 2.6* 2.4*  --   --   AST 180* 190*  --  55*  --   --   ALT 97* 116*  --  79*  --   --   ALKPHOS 55 65  --  57  --   --   BILITOT 0.5 0.4  --  0.2*  --   --   GFRNONAA 8* 7* 9* 12* 16* 15*  GFRAA 10* 8* 10* 14* 18* 18*  ANIONGAP 17* 19* 15 13 10   --      Hematology Recent Labs  Lab 05/04/2018 0122 05/25/2018 1943 05/07/18 0333  WBC 14.0* 15.6* 17.3*  RBC 3.07* 2.99* 2.86*  HGB 9.7* 9.6* 9.3*  HCT 29.0* 28.7* 27.3*  MCV 94.5 96.0 95.5  MCH 31.6 32.1 32.5  MCHC 33.4 33.4 34.1  RDW 13.0 13.3 13.2  PLT 206 220 243    Cardiac Enzymes Recent Labs  Lab 05/03/18 0605 05/03/18 0938 05/03/18 1704 05/27/2018 0122  TROPONINI 59.14* 58.78* 32.89* 11.65*    Recent Labs  Lab 05/09/2018 2200  TROPIPOC >30.00*     BNPNo results for input(s): BNP, PROBNP in the last 168 hours.   DDimer No results for input(s): DDIMER in the last 168 hours.   Radiology    No results found.  Cardiac Studies   2D echocardiogram: Study Conclusions  - Left ventricle: The cavity size was normal. Wall thickness was   increased in a pattern of severe LVH. Systolic function was   mildly to moderately reduced. The estimated ejection fraction was   in the range of 40% to 45%. There is akinesis of the   mid-apicalanteroseptal and apical myocardium. The study is not   technically sufficient to allow evaluation of LV diastolic   function. -  Left atrium: The atrium was mildly dilated. - Right ventricle: Systolic function was severely reduced. - Right atrium: The atrium was mildly dilated. - Tricuspid valve: There was moderate regurgitation. - Pulmonary arteries: Systolic pressure was mildly increased. PA   peak pressure: 41 mm Hg (S).  Impressions:  - Limited study; full doppler not performed; akinesis of the distal   anteroseptal wall and apex with overall EF 40; severe LVH;   biatrial enlargement; severe RV dysfunction; moderate TR; mild   pulmonary hypertension.  Patient Profile     73 y.o. female admitted 05/04/2018 with a late presenting anterolateral MI, and acute on chronic kidney injury. Cardiac cath 05/05/2018 with occlusion of the mid LAD. PCI was not performed given the fact that the vessel had been occluded for several days and the patient was chest pain free.   Assessment & Plan    1.  Acute anterolateral MI: Cardiac cath 05/17/2018 with occlusion of the mid LAD. The vessel was not opened due to late presentation, chronicity of occlusion. Plans for medical management of CAD. Will continue ASA, Plavix and statin. Will attempt to add low dose beta blocker as BP tolerates.   2.  Acute on chronic kidney injury: BMET pending this am. No ACE/ARB in the setting of advanced kidney disease.  3.  Atrial fibrillation/flutter: Recurrent atrial fib with RVR this am. Will restart IV amiodarone. Will restart IV heparin drip. Long term anti-coagulation will be indicated but given her renal insufficiency, will need to decide best approach as we follow her renal function over the next few days.   4. Ischemic cardiomyopathy: LVEF=40-45%. Severe hypokinesis of the apex. BP will not support addition of Coreg this am. Ace-inh ARB will not be used given renal insufficiency.       For questions or updates, please contact [place] Please consult [email or web address] for contact info under        Signed, Lauree Chandler, MD    05/07/2018, 8:18 AM

## 2018-05-07 NOTE — Progress Notes (Signed)
Received page at 7:02am that patient went back into AF RVR HR 160s this morning, BP stable per nurse and asymptomatic. BP has been softer this admission. Chart reviewed, complex pt here with MI, recent afib/flutter during hospitalization requiring amio initiation, AKI with peak Cr in 5 range (last checked 2.9 yesterday evening), and anemia. Went for cath yesterday with subacute occlusion of the LAD, managed medically, loaded with Plavix. Changed to heparin DVT ppx post-cath. Per discussion with Dr. Burt Knack, rebolus IV amio 150mg  x 1 now and resume IV heparin per pharmacy. Eliquis was considered but given CrCl <17ml/min, will need to follow renal trajectory first. Recheck BMET today.  Angla Delahunt PA-C

## 2018-05-07 NOTE — Plan of Care (Signed)
  Problem: Education: Goal: Understanding of cardiac disease, CV risk reduction, and recovery process will improve Outcome: Progressing Goal: Understanding of medication regimen will improve Outcome: Progressing   Problem: Activity: Goal: Ability to tolerate increased activity will improve Outcome: Progressing   Problem: Education: Goal: Knowledge of General Education information will improve Description Including pain rating scale, medication(s)/side effects and non-pharmacologic comfort measures Outcome: Progressing   Problem: Activity: Goal: Risk for activity intolerance will decrease Outcome: Progressing   Problem: Nutrition: Goal: Adequate nutrition will be maintained Outcome: Progressing

## 2018-05-07 NOTE — Progress Notes (Signed)
   Received call from RN earlier this evening that patient heart rate will very briefly dropped into the 30's for a few seconds and then quickly return to the 80's to 90's. This has reportedly happened a couple of times. RN was in the room with patient during one of these episodes and patient was completely asymptomatic. Patient had recurrent atrial fibrillation with RVR and was restarted on IV Amiodarone this morning. Currently at 30mg /hour. Discussed with Dr. Harl Bowie. Given that these episodes are very brief and patient is asymptomatic will continue IV Amiodarone at this time. However, if bradycardia persists may need to discontinue.  Darreld Mclean, PA-C 05/07/2018 9:48 PM

## 2018-05-07 NOTE — Progress Notes (Signed)
Dr. Idolina Primer was paged @[phone]  about patient being in Afib RVR HR 160's. Patient is asymptomatic and vitals are stable. Verbal orders were to give 150mg  IV amiodarone bolus. Patient's HR is now in the 70's. Patient is resting comfortably. Will continue to monitor for any changes.

## 2018-05-07 NOTE — Progress Notes (Addendum)
Patient went into AFIB with RVR between 130's-140's. 12 Lead EKG was done. Patient is asymptomatic and vitals are stable. Cardiology was paged x2. Still waiting for orders at this time. Will continue to monitor.

## 2018-05-07 NOTE — Progress Notes (Signed)
[facility] Progress Note    Assessment/ Plan:   73 y/o with anterolateral MI this admission + acute on CKD with cardiac cath on 05/12/2018.   1.  Acute kidney injury.  Creatinine on admission around 5.  s/p cath on 05/15/2018 and she is at risk for developing contrast nephropathy. Will monitor closely.  No urgent indication for dialysis.   We will continue to follow closely.  Hold ACE/ARB in setting of renal failure.  - Mild bump in Cr likely secondary to contrast; will continue to track trend. UOP also decreasing which is concerning. No acute indication for RRT and hopefully we don't have to make that decision.  2.  Hyponatremia.  Avoid hypotonic fluids..  Likely secondary to severe kidney injury and decreased effective circulatory volume setting of heart failure. But fortunately improving trend. Stable.  3.  Acute anterolateral MI.  For cardiac catheterization 05/03/2018. 2D echocardiogram revealed: Limited study; full doppler not performed; akinesis of the distal anteroseptal wall and apex with overall EF 40; severe LVH; biatrial enlargement; severe RV dysfunction; moderate TR; mild pulmonary hypertension. - Cath 100% occluded LAD after 1st diag  4.  Anemia.  Stable hemoglobin.  Check iron panel.  Subjective:   Denies f/c/n/v/dyspnea.   Objective:   BP 104/71 (BP Location: Left Arm)   Pulse 81   Temp 98.3 F (36.8 C) (Oral)   Resp 19   Ht 5\' 3"  (1.6 m)   Wt 59.9 kg   SpO2 98%   BMI 23.39 kg/m   Intake/Output Summary (Last 24 hours) at 05/07/2018 1342 Last data filed at 05/07/2018 1200 Gross per 24 hour  Intake 934.51 ml  Output 800 ml  Net 134.51 ml   Weight change:   Physical Exam: General:  A&Ox3 NAD CV:  Heart RRR with 3 out of 6 systolic murmur Lungs:  L/S CTA bilaterally Abd:  abd SNT/ND with normal BS Extremities:  No LE edema. Skin:  No skin rash Neurologic: Nonfocal exam  Imaging: No results found.  Labs: BMET Recent Labs  Lab  05/22/2018 2154 05/03/2018 2202 05/03/18 7425 05/03/18 9563 05/04/18 0339 05/05/18 0216 05/05/2018 0122 05/21/2018 1943 05/07/18 0821  NA 135 131* 115* 134* 131* 128* 129*  --  133*  K 4.2 4.2 3.6 3.9 3.3* 3.8 4.0  --  4.5  CL 93* 95* 81* 96* 94* 94* 97*  --  98  CO2 17*  --  17* 19* 22 21* 22  --  19*  GLUCOSE 192* 192* 744* 123* 127* 141* 128*  --  175*  BUN 69* 71* 73* 89* 105* 97* 80*  --  71*  CREATININE 5.19* 5.10* 4.86* 5.64* 4.68* 3.65* 2.87* 2.96* 3.02*  CALCIUM 9.2  --  7.4* 8.3* 8.1* 8.3* 8.6*  --  9.0  PHOS  --   --   --   --  3.1  --   --   --   --    CBC Recent Labs  Lab 05/10/2018 2154  05/05/18 0216 05/05/2018 0122 05/01/2018 1943 05/07/18 0333  WBC 23.3*   < > 15.0* 14.0* 15.6* 17.3*  NEUTROABS 18.8*  --   --   --   --   --   HGB 11.8*   < > 9.5* 9.7* 9.6* 9.3*  HCT 35.2*   < > 28.2* 29.0* 28.7* 27.3*  MCV 98.1   < > 92.8 94.5 96.0 95.5  PLT 177   < > 185 206 220 243   < > =  values in this interval not displayed.    Medications:    . aspirin  81 mg Oral Daily  . atorvastatin  40 mg Oral q1800  . clopidogrel  75 mg Oral Q breakfast  . pneumococcal 23 valent vaccine  0.5 mL Intramuscular Tomorrow-1000  . sodium chloride flush  3 mL Intravenous Q12H      Otelia Santee, MD 05/07/2018, 1:42 PM

## 2018-05-07 NOTE — Progress Notes (Signed)
[facility] for heparin Indication: chest pain/ACS  No Known Allergies  Patient Measurements: Height: 5\' 3"  (160 cm) Weight: 132 lb 0.9 oz (59.9 kg) IBW/kg (Calculated) : 52.4 Heparin Dosing Weight: 60kg  Vital Signs: Temp: 98.3 F (36.8 C) (01/07 0401) Temp Source: Oral (01/07 0401) BP: 112/82 (01/07 0900) Pulse Rate: 117 (01/07 0900)  Labs: Recent Labs    05/05/18 0216 05/05/18 1916 05/10/2018 0122 05/10/2018 1943 05/07/18 0333 05/07/18 0821  HGB 9.5*  --  9.7* 9.6* 9.3*  --   HCT 28.2*  --  29.0* 28.7* 27.3*  --   PLT 185  --  206 220 243  --   HEPARINUNFRC 0.26* 0.31 0.35  --   --   --   CREATININE 3.65*  --  2.87* 2.96*  --  3.02*  TROPONINI  --   --  11.65*  --   --   --     Estimated Creatinine Clearance: 13.9 mL/min (A) (by C-G formula based on SCr of 3.02 mg/dL (H)).   Medical History: Past Medical History:  Diagnosis Date  . Hypertension   . Renal disorder    Assessment: 73 year old female presented to [facility] with weakness, ST elevations noted on EKG. Unfortunately after interview it would seem that patient's heart attack was several days ago. Patient is s/p cath on 05/26/2018. Pharmacy has been consulted to restart heparin gtt for afib (patient in Afib RVR 05/07/18 AM).  Patient's CBC is stable this AM, with no overt bleeding or complications noted.  Goal of Therapy:  Heparin level 0.3-0.7 units/ml Monitor platelets by anticoagulation protocol: Yes   Plan:  Start IV heparin at 800 units/hr Confirm level in 8 hrs Monitor anti-Xa level daily while on heparin Continue to monitor H&H, platelets, and for s/sx of bleeding F/u plans for oral anticoagulation  Thank you for allowing pharmacy to be a part of this patient's care.  Leron Croak, PharmD PGY1 Pharmacy Resident Phone: [phone]  Please check AMION for all [place] phone numbers  05/07/2018 10:36 AM

## 2018-05-08 DIAGNOSIS — I480 Paroxysmal atrial fibrillation: Secondary | ICD-10-CM

## 2018-05-08 LAB — CBC
HCT: 27.8 % — ABNORMAL LOW (ref 36.0–46.0)
HEMOGLOBIN: 9.3 g/dL — AB (ref 12.0–15.0)
MCH: 31.7 pg (ref 26.0–34.0)
MCHC: 33.5 g/dL (ref 30.0–36.0)
MCV: 94.9 fL (ref 80.0–100.0)
Platelets: 277 10*3/uL (ref 150–400)
RBC: 2.93 MIL/uL — AB (ref 3.87–5.11)
RDW: 13.4 % (ref 11.5–15.5)
WBC: 17.5 10*3/uL — ABNORMAL HIGH (ref 4.0–10.5)
nRBC: 1.3 % — ABNORMAL HIGH (ref 0.0–0.2)

## 2018-05-08 LAB — BASIC METABOLIC PANEL
Anion gap: 11 (ref 5–15)
BUN: 74 mg/dL — ABNORMAL HIGH (ref 8–23)
CO2: 22 mmol/L (ref 22–32)
Calcium: 8.7 mg/dL — ABNORMAL LOW (ref 8.9–10.3)
Chloride: 98 mmol/L (ref 98–111)
Creatinine, Ser: 3.4 mg/dL — ABNORMAL HIGH (ref 0.44–1.00)
GFR calc Af Amer: 15 mL/min — ABNORMAL LOW (ref >60)
GFR calc non Af Amer: 13 mL/min — ABNORMAL LOW (ref >60)
Glucose, Bld: 136 mg/dL — ABNORMAL HIGH (ref 70–99)
Potassium: 4.5 mmol/L (ref 3.5–5.1)
SODIUM: 131 mmol/L — AB (ref 135–145)

## 2018-05-08 LAB — HEPARIN LEVEL (UNFRACTIONATED): Heparin Unfractionated: 0.42 IU/mL (ref 0.30–0.70)

## 2018-05-08 MED ORDER — AMIODARONE HCL 200 MG PO TABS
400.0000 mg | ORAL_TABLET | Freq: Two times a day (BID) | ORAL | Status: DC
  Administered 2018-05-08 – 2018-05-14 (×14): 400 mg via ORAL
  Filled 2018-05-08 (×14): qty 2

## 2018-05-08 NOTE — Progress Notes (Signed)
[facility] Progress Note    Assessment/ Plan:   73 y/o with anterolateral MI this admission + acute on CKD with cardiac cath on 05/25/2018.   1. Acute kidney injury. Creatinine on admission around 5. s/p cath on 05/19/2018 and she is at risk for developing contrast nephropathy. Will monitor closely. No urgent indication for dialysis. We will continue to follow closely. Hold ACE/ARB in setting of renal failure.  - Continued bump in Cr secondary to contrast; will continue to track trend. UOP also decreasing which is concerning. No acute indication for RRT.   2. Hyponatremia. Avoid hypotonicfluids.. Likely secondary to severe kidney injury and decreased effective circulatory volume setting of heart failure. But fortunately improving trend. Stable.  3. Acute anterolateral MI. For cardiac catheterization 05/25/2018. 2D echocardiogram revealed: Limited study; full doppler not performed; akinesis of the distal anteroseptal wall and apex with overall EF 40; severe LVH; biatrial enlargement; severe RV dysfunction; moderate TR; mild pulmonary hypertension. - Cath 100% occluded LAD after 1st diag  4. Anemia. Stable hemoglobin. Check iron panel.  Subjective:   Denies f/c/n/v.  Mild dyspnea with cough, sometimes productive of white sputum.   Objective:   BP 102/73   Pulse 68   Temp 98.3 F (36.8 C) (Oral)   Resp (!) 30   Ht 5\' 3"  (1.6 m)   Wt 59.9 kg   SpO2 99%   BMI 23.39 kg/m   Intake/Output Summary (Last 24 hours) at 05/08/2018 1348 Last data filed at 05/08/2018 1100 Gross per 24 hour  Intake 1117.24 ml  Output 200 ml  Net 917.24 ml   Weight change:   Physical Exam: General: A&Ox3 NAD CV: Heart RRR with 3 out of 6 systolic murmur Lungs: Rales at bases Abd: abd SNT/ND with normal BS Extremities: No LE edema. Skin: No skin rash Neurologic: Nonfocal exam  Imaging: No results found.  Labs: BMET Recent Labs  Lab  05/03/18 [id number] 05/03/18 8563 05/04/18 0339 05/05/18 0216 05/05/2018 0122 05/14/2018 1943 05/07/18 0821 05/08/18 0338  NA 115* 134* 131* 128* 129*  --  133* 131*  K 3.6 3.9 3.3* 3.8 4.0  --  4.5 4.5  CL 81* 96* 94* 94* 97*  --  98 98  CO2 17* 19* 22 21* 22  --  19* 22  GLUCOSE 744* 123* 127* 141* 128*  --  175* 136*  BUN 73* 89* 105* 97* 80*  --  71* 74*  CREATININE 4.86* 5.64* 4.68* 3.65* 2.87* 2.96* 3.02* 3.40*  CALCIUM 7.4* 8.3* 8.1* 8.3* 8.6*  --  9.0 8.7*  PHOS  --   --  3.1  --   --   --   --   --    CBC Recent Labs  Lab 05/09/2018 2154  05/01/2018 0122 05/15/2018 1943 05/07/18 0333 05/08/18 0338  WBC 23.3*   < > 14.0* 15.6* 17.3* 17.5*  NEUTROABS 18.8*  --   --   --   --   --   HGB 11.8*   < > 9.7* 9.6* 9.3* 9.3*  HCT 35.2*   < > 29.0* 28.7* 27.3* 27.8*  MCV 98.1   < > 94.5 96.0 95.5 94.9  PLT 177   < > 206 220 243 277   < > = values in this interval not displayed.    Medications:    . amiodarone  400 mg Oral BID  . aspirin  81 mg Oral Daily  . atorvastatin  40 mg Oral q1800  . clopidogrel  75 mg Oral Q breakfast  . pneumococcal 23 valent vaccine  0.5 mL Intramuscular Tomorrow-1000  . sodium chloride flush  3 mL Intravenous Q12H      Otelia Santee, MD 05/08/2018, 1:48 PM

## 2018-05-08 NOTE — Progress Notes (Signed)
[facility] for heparin Indication: chest pain/ACS  No Known Allergies  Patient Measurements: Height: 5\' 3"  (160 cm) Weight: 132 lb 0.9 oz (59.9 kg) IBW/kg (Calculated) : 52.4 Heparin Dosing Weight: 60kg  Vital Signs: Temp: 97.6 F (36.4 C) (01/08 0759) Temp Source: Oral (01/08 0759) BP: 108/49 (01/08 0900) Pulse Rate: 67 (01/08 0900)  Labs: Recent Labs    05/11/2018 0122 05/22/2018 1943 05/07/18 0333 05/07/18 0821 05/07/18 1626 05/08/18 0338  HGB 9.7* 9.6* 9.3*  --   --  9.3*  HCT 29.0* 28.7* 27.3*  --   --  27.8*  PLT 206 220 243  --   --  277  HEPARINUNFRC 0.35  --   --   --  0.34 0.42  CREATININE 2.87* 2.96*  --  3.02*  --  3.40*  TROPONINI 11.65*  --   --   --   --   --     Estimated Creatinine Clearance: 12.4 mL/min (A) (by C-G formula based on SCr of 3.4 mg/dL (H)).   Medical History: Past Medical History:  Diagnosis Date  . Hypertension   . Renal disorder    Assessment: 73 year old female presented to [facility] with weakness, ST elevations noted on EKG. Unfortunately after interview it would seem that patient's heart attack was several days ago. Patient is s/p cath on 05/05/2018. Pharmacy has been consulted to restart heparin gtt for afib (patient in Afib RVR 05/07/18 AM).  Patient's heparin level is therapeutic this AM at 0.42. Patient's CBC is stable this AM, with no overt bleeding or complications per nurse.  Goal of Therapy:  Heparin level 0.3-0.7 units/ml Monitor platelets by anticoagulation protocol: Yes   Plan:  Continue IV heparin at 800 units/hr Monitor anti-Xa level daily while on heparin Continue to monitor H&H, platelets, and for s/sx of bleeding F/u plans for oral anticoagulation  Thank you for allowing pharmacy to be a part of this patient's care.  Leron Croak, PharmD PGY1 Pharmacy Resident Phone: [phone]  Please check AMION for all [place] phone numbers  05/08/2018 9:47 AM

## 2018-05-08 NOTE — Plan of Care (Signed)
  Problem: Education: Goal: Understanding of cardiac disease, CV risk reduction, and recovery process will improve Outcome: Progressing   Problem: Safety: Goal: Ability to remain free from injury will improve Outcome: Progressing   Problem: Activity: Goal: Ability to return to baseline activity level will improve Outcome: Progressing   Problem: Cardiovascular: Goal: Ability to achieve and maintain adequate cardiovascular perfusion will improve Outcome: Progressing

## 2018-05-08 NOTE — Progress Notes (Signed)
Progress Note  Patient Name: Brandy Salazar Date of Encounter: 05/08/2018  Primary Cardiologist: No primary care provider on file.   Subjective   Feels weak.  No chest pain or shortness of breath.  Inpatient Medications    Scheduled Meds: . aspirin  81 mg Oral Daily  . atorvastatin  40 mg Oral q1800  . clopidogrel  75 mg Oral Q breakfast  . pneumococcal 23 valent vaccine  0.5 mL Intramuscular Tomorrow-1000  . sodium chloride flush  3 mL Intravenous Q12H   Continuous Infusions: . sodium chloride Stopped (05/05/18 1619)  . sodium chloride    . amiodarone 30 mg/hr (05/08/18 0801)  . heparin 800 Units/hr (05/08/18 0800)  . norepinephrine (LEVOPHED) Adult infusion Stopped (05/04/18 2103)   PRN Meds: sodium chloride, acetaminophen, menthol-cetylpyridinium, ondansetron (ZOFRAN) IV, sodium chloride flush, traMADol   Vital Signs    Vitals:   05/08/18 0700 05/08/18 0759 05/08/18 0800 05/08/18 0900  BP: 95/67  100/70 (!) 108/49  Pulse: 63  74 67  Resp: 20  20 (!) 22  Temp:  97.6 F (36.4 C)    TempSrc:  Oral    SpO2: 99%  100% 98%  Weight:      Height:        Intake/Output Summary (Last 24 hours) at 05/08/2018 0945 Last data filed at 05/08/2018 0900 Gross per 24 hour  Intake 1316.23 ml  Output 200 ml  Net 1116.23 ml   Filed Weights   05/21/2018 2131 05/03/18 0030 05/05/18 0500  Weight: 59.9 kg 57.2 kg 59.9 kg    Telemetry    Sinus rhythm, episodes of marked bradycardia but no high-grade AV block, no atrial fib identified - Personally Reviewed   Physical Exam  Alert, oriented, pleasant elderly woman GEN: No acute distress.   Neck: No JVD Cardiac: RRR, 2/6 systolic murmur best heard at the apex Respiratory:  Diffuse rhonchi bilaterally GI: Soft, nontender, non-distended  MS: No edema; No deformity. Neuro:  Nonfocal  Psych: Normal affect   Labs    Chemistry Recent Labs  Lab 05/03/18 6295 05/03/18 2841 05/04/18 3244 05/05/18 0216 05/26/2018 0122  05/28/2018 1943 05/07/18 0821 05/08/18 0338  NA 115* 134* 131* 128* 129*  --  133* 131*  K 3.6 3.9 3.3* 3.8 4.0  --  4.5 4.5  CL 81* 96* 94* 94* 97*  --  98 98  CO2 17* 19* 22 21* 22  --  19* 22  GLUCOSE 744* 123* 127* 141* 128*  --  175* 136*  BUN 73* 89* 105* 97* 80*  --  71* 74*  CREATININE 4.86* 5.64* 4.68* 3.65* 2.87* 2.96* 3.02* 3.40*  CALCIUM 7.4* 8.3* 8.1* 8.3* 8.6*  --  9.0 8.7*  PROT 5.3* 6.7  --  5.7*  --   --   --   --   ALBUMIN 2.4* 2.8* 2.6* 2.4*  --   --   --   --   AST 180* 190*  --  55*  --   --   --   --   ALT 97* 116*  --  79*  --   --   --   --   ALKPHOS 55 65  --  57  --   --   --   --   BILITOT 0.5 0.4  --  0.2*  --   --   --   --   GFRNONAA 8* 7* 9* 12* 16* 15* 15* 13*  GFRAA 10* 8* 10* 14*  18* 18* 17* 15*  ANIONGAP 17* 19* 15 13 10   --  16* 11     Hematology Recent Labs  Lab 05/24/2018 1943 05/07/18 0333 05/08/18 0338  WBC 15.6* 17.3* 17.5*  RBC 2.99* 2.86* 2.93*  HGB 9.6* 9.3* 9.3*  HCT 28.7* 27.3* 27.8*  MCV 96.0 95.5 94.9  MCH 32.1 32.5 31.7  MCHC 33.4 34.1 33.5  RDW 13.3 13.2 13.4  PLT 220 243 277    Cardiac Enzymes Recent Labs  Lab 05/03/18 0605 05/03/18 0938 05/03/18 1704 05/18/2018 0122  TROPONINI 59.14* 58.78* 32.89* 11.65*    Recent Labs  Lab 05/26/2018 2200  TROPIPOC >30.00*     BNPNo results for input(s): BNP, PROBNP in the last 168 hours.   DDimer No results for input(s): DDIMER in the last 168 hours.   Radiology    No results found.  Cardiac Studies   Cardiac catheterization 05/16/2018: Conclusion     Prox LAD to Mid LAD lesion is 100% stenosed.  LV end diastolic pressure is moderately elevated.   SUMMARY  Severe single-vessel disease with 100% occluded LAD after 1st Diag/SP2.  Mild to moderately elevated LVEDP  RECOMMENDATIONS  Likely subacute occlusion of the LAD, with apical aneurysmal dilation of the LAD territory on echocardiogram, would recommend medical management based on the OAT Trial  We will  load with Plavix 300 mg today and start 10 mg daily beginning tomorrow  Continue aggressive risk factor modification and CHF management   2D echocardiogram 05/14/2018: Study Conclusions  - Left ventricle: The cavity size was normal. Wall thickness was   increased in a pattern of basal severe LVH. Systolic function was   mildly to moderately reduced. The estimated ejection fraction was   in the range of 40% to 45%. There is akinesis of the   mid-apicalanteroseptal, anterior, anterolateral, inferior,   inferoseptal, and apical myocardium. Doppler parameters are   consistent with abnormal left ventricular relaxation (grade 1   diastolic dysfunction). - Aortic valve: Trileaflet; mildly thickened, mildly calcified   leaflets. - Left atrium: The atrium was mildly dilated. - Right ventricle: Systolic function was moderately reduced. - Right atrium: The atrium was moderately dilated. - Tricuspid valve: There was moderate regurgitation. - Pulmonary arteries: Systolic pressure was severely increased. PA   peak pressure: 78 mm Hg (S).  Impressions:  - Compared to the prior study, there has been no significant   interval change.  Patient Profile     73 y.o. female admitted 05/14/2018 with a late presenting anterolateral MI, and acute on chronic kidney injury. Cardiac cath 05/10/2018 with occlusion of the mid LAD. PCI was not performed given the fact that the vessel had been occluded for several days and the patient was chest pain free.   Assessment & Plan    1.  Acute anterolateral STEMI: Late presentation.  Cardiac catheterization demonstrated total occlusion of the mid LAD.  PCI deferred secondary to late presentation.  Patient currently treated with aspirin, clopidogrel, and a high intensity statin drug.  Unable to use a beta-blocker because of bradycardia at present.  No ACE/ARB/Spironolactone secondary to advanced kidney disease.  2.  Acute on chronic kidney injury: The patient's metabolic  panel demonstrates worsening creatinine, 3.4 mg/dL today.  She is about 48 hours out from contrast administration with cardiac catheterization.  Renal team following, appreciate their care.  Hopefully her creatinine will plateau.  3.  Paroxysmal atrial fibrillation/flutter: We will convert from IV back to oral amiodarone today.  Suspect bradycardic episodes  related to IV amiodarone and baseline conduction disease.  Continue to follow on telemetry.  The patient will likely need warfarin, but we will continue IV heparin for now in case she has progressive renal dysfunction and requires further invasive procedures.  Is for creatinine plateaus over the next 24 hours we will start her on warfarin tomorrow.  Disposition: Keep in ICU today with issues related to her heart rhythm.  If she is stable over the next 24 hours with conversion from IV to oral amiodarone, will transfer her to the floor.  Will place PT/OT consultation.      For questions or updates, please contact [place] Please consult [email or web address] for contact info under        Signed, Sherren Mocha, MD  05/08/2018, 9:45 AM

## 2018-05-09 ENCOUNTER — Inpatient Hospital Stay (HOSPITAL_COMMUNITY): Payer: Medicare PPO

## 2018-05-09 LAB — CBC
HCT: 27.5 % — ABNORMAL LOW (ref 36.0–46.0)
Hemoglobin: 9.1 g/dL — ABNORMAL LOW (ref 12.0–15.0)
MCH: 31.5 pg (ref 26.0–34.0)
MCHC: 33.1 g/dL (ref 30.0–36.0)
MCV: 95.2 fL (ref 80.0–100.0)
Platelets: 325 10*3/uL (ref 150–400)
RBC: 2.89 MIL/uL — ABNORMAL LOW (ref 3.87–5.11)
RDW: 13.5 % (ref 11.5–15.5)
WBC: 16.2 10*3/uL — ABNORMAL HIGH (ref 4.0–10.5)
nRBC: 1.2 % — ABNORMAL HIGH (ref 0.0–0.2)

## 2018-05-09 LAB — BASIC METABOLIC PANEL
Anion gap: 12 (ref 5–15)
BUN: 78 mg/dL — ABNORMAL HIGH (ref 8–23)
CO2: 21 mmol/L — ABNORMAL LOW (ref 22–32)
Calcium: 8.8 mg/dL — ABNORMAL LOW (ref 8.9–10.3)
Chloride: 98 mmol/L (ref 98–111)
Creatinine, Ser: 4.3 mg/dL — ABNORMAL HIGH (ref 0.44–1.00)
GFR calc Af Amer: 11 mL/min — ABNORMAL LOW (ref >60)
GFR calc non Af Amer: 10 mL/min — ABNORMAL LOW (ref >60)
Glucose, Bld: 139 mg/dL — ABNORMAL HIGH (ref 70–99)
Potassium: 4.7 mmol/L (ref 3.5–5.1)
Sodium: 131 mmol/L — ABNORMAL LOW (ref 135–145)

## 2018-05-09 LAB — HEPARIN LEVEL (UNFRACTIONATED): HEPARIN UNFRACTIONATED: 0.51 [IU]/mL (ref 0.30–0.70)

## 2018-05-09 MED ORDER — SODIUM CHLORIDE 0.9 % IV SOLN
510.0000 mg | Freq: Once | INTRAVENOUS | Status: AC
  Administered 2018-05-09: 510 mg via INTRAVENOUS
  Filled 2018-05-09: qty 17

## 2018-05-09 NOTE — Progress Notes (Signed)
ANTICOAGULATION CONSULT NOTE - Initial Consult  Pharmacy Consult for heparin Indication: chest pain/ACS  No Known Allergies  Patient Measurements: Height: 5\' 3"  (160 cm) Weight: 132 lb 0.9 oz (59.9 kg) IBW/kg (Calculated) : 52.4 Heparin Dosing Weight: 60 kg  Vital Signs: Temp: 97.9 F (36.6 C) (01/09 0759) Temp Source: Oral (01/09 0759) BP: 108/74 (01/09 1000) Pulse Rate: 74 (01/09 0900)  Labs: Recent Labs    05/07/18 0333 05/07/18 0821 05/07/18 1626 05/08/18 0338 05/09/18 0349 05/09/18 0829  HGB 9.3*  --   --  9.3* 9.1*  --   HCT 27.3*  --   --  27.8* 27.5*  --   PLT 243  --   --  277 325  --   HEPARINUNFRC  --   --  0.34 0.42 0.51  --   CREATININE  --  3.02*  --  3.40*  --  4.30*    Estimated Creatinine Clearance: 9.8 mL/min (A) (by C-G formula based on SCr of 4.3 mg/dL (H)).   Medical History: Past Medical History:  Diagnosis Date  . Hypertension   . Renal disorder     Assessment: 73 year old female presented to [facility] with weakness, ST elevations noted on EKG. Unfortunately after interview it would seem that patient's heart attack was several days ago. Patient is s/p cath on 05/03/2018. Pharmacy has been consulted to restart heparin gtt for afib (patient in Afib RVR 05/07/18 AM).  Patient's heparin level is therapeutic this AM at 0.51. Patient continues to have episodes of Afib. Patient's CBC is stable this AM, with no overt bleeding or complications per nurse.  Goal of Therapy:  Heparin level 0.3-0.7 units/ml Monitor platelets by anticoagulation protocol: Yes   Plan:  Continue IV heparin at 800 units/hr Monitor anti-Xa level daily while on heparin Continue to monitor H&H, platelets, and for s/sx of bleeding F/u plans for oral anticoagulation   [place] Student 05/09/2018,11:42 AM

## 2018-05-09 NOTE — Evaluation (Signed)
Occupational Therapy Evaluation Patient Details Name: Brandy Salazar MRN: [id number] DOB: [birth year] Today's Date: 05/09/2018    History of Present Illness Pt adm with STEMI complicated by acute kidney injury. PMH - HTN, CKD   Clinical Impression   Pt is typically independent in self care and IADL. Her neighbor assists with transportation. Pt presents with decreased activity tolerance and impaired standing balance. She requires set up to min assist for ADL and ambulated with a RW with min guard assist. Pt's daughter is in town from out of state, but unclear how long she can stay to assist her mother. Recommending 24 hour care when pt is initially discharged. Will follow acutely.    Follow Up Recommendations  Supervision/Assistance - 24 hour;Home health OT(SNF if family cannot provide assist)    Equipment Recommendations  None recommended by OT    Recommendations for Other Services       Precautions / Restrictions Precautions Precautions: Fall Restrictions Weight Bearing Restrictions: No      Mobility Bed Mobility               General bed mobility comments: Pt up in chair  Transfers Overall transfer level: Needs assistance Equipment used: 1 person hand held assist Transfers: Sit to/from Stand;Stand Pivot Transfers Sit to Stand: Min guard Stand pivot transfers: Min guard       General transfer comment: Assist for safety and mobility    Balance Overall balance assessment: Needs assistance Sitting-balance support: No upper extremity supported;Feet supported Sitting balance-Leahy Scale: Good     Standing balance support: No upper extremity supported;During functional activity Standing balance-Leahy Scale: Fair                             ADL either performed or assessed with clinical judgement   ADL Overall ADL's : Needs assistance/impaired Eating/Feeding: Independent;Sitting   Grooming: Wash/dry hands;Sitting;Set up   Upper Body Bathing:  Minimal assistance;Sitting   Lower Body Bathing: Minimal assistance;Sit to/from stand   Upper Body Dressing : Minimal assistance;Sitting   Lower Body Dressing: Minimal assistance;Sit to/from stand Lower Body Dressing Details (indicate cue type and reason): able to don and doff socks with increased time Toilet Transfer: Minimal assistance;Ambulation;Comfort height toilet;RW   Toileting- Clothing Manipulation and Hygiene: Minimal assistance;Sit to/from stand       Functional mobility during ADLs: Minimal assistance;Rolling walker General ADL Comments: Pt primarily needing assist due to lines, safety and decreased activity tolerance.     Vision Baseline Vision/History: Wears glasses Wears Glasses: Reading only Patient Visual Report: No change from baseline       Perception     Praxis      Pertinent Vitals/Pain Pain Assessment: No/denies pain     Hand Dominance Right   Extremity/Trunk Assessment Upper Extremity Assessment Upper Extremity Assessment: Overall WFL for tasks assessed   Lower Extremity Assessment Lower Extremity Assessment: Defer to PT evaluation       Communication Communication Communication: No difficulties   Cognition Arousal/Alertness: Awake/alert Behavior During Therapy: WFL for tasks assessed/performed Overall Cognitive Status: Within Functional Limits for tasks assessed                                     General Comments       Exercises     Shoulder Instructions      Home Living Family/patient expects to be discharged  to:: Private residence Living Arrangements: Alone Available Help at Discharge: Friend(s);Available PRN/intermittently Type of Home: House Home Access: Stairs to enter [employer] of Steps: 4 Entrance Stairs-Rails: Right;Left Home Layout: One level     Bathroom Shower/Tub: Teacher, early years/pre: Standard     Home Equipment: None   Additional Comments: daughter is in town  from [place], unsure how long      Prior Functioning/Environment Level of Independence: Independent        Comments: doesn't drive        OT Problem List: Decreased strength;Decreased activity tolerance;Impaired balance (sitting and/or standing);Decreased knowledge of use of DME or AE;Cardiopulmonary status limiting activity      OT Treatment/Interventions: Self-care/ADL training;DME and/or AE instruction;Patient/family education;Balance training;Therapeutic activities;Energy conservation    OT Goals(Current goals can be found in the care plan section) Acute Rehab OT Goals Patient Stated Goal: return home OT Goal Formulation: With patient Time For Goal Achievement: May 31, 2018 Potential to Achieve Goals: Good ADL Goals Pt Will Perform Grooming: with supervision;standing Pt Will Perform Upper Body Bathing: with set-up;sitting Pt Will Perform Lower Body Bathing: with supervision;sit to/from stand Pt Will Perform Upper Body Dressing: with set-up;sitting Pt Will Perform Lower Body Dressing: with supervision;sit to/from stand Pt Will Transfer to Toilet: with supervision;ambulating;regular height toilet Pt Will Perform Toileting - Clothing Manipulation and hygiene: with supervision;sit to/from stand Additional ADL Goal #1: Pt will utilize energy conservation strategies in ADL and mobility independently.  OT Frequency: Min 2X/week   Barriers to D/C: Decreased caregiver support          Co-evaluation PT/OT/SLP Co-Evaluation/Treatment: Yes Reason for Co-Treatment: Complexity of the patient's impairments (multi-system involvement);For patient/therapist safety PT goals addressed during session: Mobility/safety with mobility OT goals addressed during session: ADL's and self-care      AM-PAC OT "6 Clicks" Daily Activity     Outcome Measure Help from another person eating meals?: None Help from another person taking care of personal grooming?: A Little Help from another person  toileting, which includes using toliet, bedpan, or urinal?: A Little Help from another person bathing (including washing, rinsing, drying)?: A Little Help from another person to put on and taking off regular upper body clothing?: A Little Help from another person to put on and taking off regular lower body clothing?: A Little 6 Click Score: 19   End of Session Equipment Utilized During Treatment: Rolling walker Nurse Communication: Mobility status  Activity Tolerance: Patient limited by fatigue Patient left: in chair;with call bell/phone within reach  OT Visit Diagnosis: Unsteadiness on feet (R26.81);Other abnormalities of gait and mobility (R26.89);Muscle weakness (generalized) (M62.81);Other (comment)(decreased activity tolerance)                Time: 3419-6222 OT Time Calculation (min): 26 min Charges:  OT General Charges $OT Visit: 1 Visit OT Evaluation $OT Eval Moderate Complexity: 1 Mod  Nestor Lewandowsky, OTR/L Acute Rehabilitation Services Pager: [phone] Office: [phone]  Brandy Salazar 05/09/2018, 3:45 PM

## 2018-05-09 NOTE — Evaluation (Signed)
Physical Therapy Evaluation Patient Details Name: Brandy Salazar MRN: [id number] DOB: Dec 31, 1945 Today's Date: 05/09/2018   History of Present Illness  Pt adm with STEMI complicated by acute kidney injury. PMH - HTN, CKD  Clinical Impression  Pt presents to PT with decreased mobility and significantly decreased activity tolerance. Currently do not feel pt can manage at home alone. Pt's daughter is visiting from out of town but pt is unsure if she could stay several weeks with pt at [place]. If daughter able to stay several weeks than pt could likely go home with her assistance. If daughter is unable to stay I believe pt will need ST-SNF.     Follow Up Recommendations Home health PT;Supervision/Assistance - 65 hour(if daughter can stay in town for several weeks, if not SNF)    Equipment Recommendations  Other (comment)(likely rollator)    Recommendations for Other Services       Precautions / Restrictions Precautions Precautions: Fall Restrictions Weight Bearing Restrictions: No      Mobility  Bed Mobility               General bed mobility comments: Pt up in chair  Transfers Overall transfer level: Needs assistance Equipment used: 1 person hand held assist Transfers: Sit to/from Stand;Stand Pivot Transfers Sit to Stand: Min guard Stand pivot transfers: Min guard       General transfer comment: Assist for safety and mobility  Ambulation/Gait Ambulation/Gait assistance: Min assist Gait Distance (Feet): 100 Feet Assistive device: 4-wheeled walker;1 person hand held assist Gait Pattern/deviations: Step-through pattern;Decreased step length - right;Decreased step length - left;Trunk flexed;Drifts right/left;Narrow base of support Gait velocity: decr Gait velocity interpretation: <1.31 ft/sec, indicative of household ambulator General Gait Details: Assist for balance and support. RR to 40 with amb. Pt sat on rollator and rolled back to room when fatigued  Stairs             Wheelchair Mobility    Modified Rankin (Stroke Patients Only)       Balance Overall balance assessment: Needs assistance Sitting-balance support: No upper extremity supported;Feet supported Sitting balance-Leahy Scale: Good     Standing balance support: No upper extremity supported;During functional activity Standing balance-Leahy Scale: Fair                               Pertinent Vitals/Pain Pain Assessment: No/denies pain    Home Living Family/patient expects to be discharged to:: Private residence Living Arrangements: Alone Available Help at Discharge: Friend(s);Available PRN/intermittently(next door neighbor can check on ) Type of Home: House Home Access: Stairs to enter Entrance Stairs-Rails: Psychiatric nurse of Steps: 4 Home Layout: One level Home Equipment: None      Prior Function Level of Independence: Independent         Comments: doesn't drive     Hand Dominance   Dominant Hand: Right    Extremity/Trunk Assessment   Upper Extremity Assessment Upper Extremity Assessment: Defer to OT evaluation    Lower Extremity Assessment Lower Extremity Assessment: Generalized weakness       Communication   Communication: No difficulties  Cognition Arousal/Alertness: Awake/alert Behavior During Therapy: WFL for tasks assessed/performed Overall Cognitive Status: Within Functional Limits for tasks assessed  General Comments      Exercises     Assessment/Plan    PT Assessment Patient needs continued PT services  PT Problem List Decreased strength;Decreased activity tolerance;Decreased balance;Decreased mobility;Decreased knowledge of use of DME       PT Treatment Interventions DME instruction;Gait training;Stair training;Functional mobility training;Therapeutic activities;Therapeutic exercise;Balance training;Patient/family education    PT Goals  (Current goals can be found in the Care Plan section)  Acute Rehab PT Goals Patient Stated Goal: return home PT Goal Formulation: With patient Time For Goal Achievement: 2018/05/25 Potential to Achieve Goals: Good    Frequency Min 3X/week   Barriers to discharge Decreased caregiver support;Inaccessible home environment stairs to enter and pt lives alone    Co-evaluation PT/OT/SLP Co-Evaluation/Treatment: Yes Reason for Co-Treatment: Complexity of the patient's impairments (multi-system involvement);For patient/therapist safety(limited activity tolerance) PT goals addressed during session: Mobility/safety with mobility         AM-PAC PT "6 Clicks" Mobility  Outcome Measure Help needed turning from your back to your side while in a flat bed without using bedrails?: None Help needed moving from lying on your back to sitting on the side of a flat bed without using bedrails?: None Help needed moving to and from a bed to a chair (including a wheelchair)?: A Little Help needed standing up from a chair using your arms (e.g., wheelchair or bedside chair)?: A Little Help needed to walk in hospital room?: A Little Help needed climbing 3-5 steps with a railing? : Total 6 Click Score: 18    End of Session   Activity Tolerance: Patient limited by fatigue Patient left: in chair;with call bell/phone within reach Nurse Communication: Mobility status PT Visit Diagnosis: Unsteadiness on feet (R26.81);Muscle weakness (generalized) (M62.81)    Time: 6384-6659 PT Time Calculation (min) (ACUTE ONLY): 27 min   Charges:   PT Evaluation $PT Eval Moderate Complexity: 1 [place] Pager [phone] Office [place] 05/09/2018, 3:13 PM

## 2018-05-09 NOTE — Progress Notes (Signed)
[facility] Progress Note    Assessment/ Plan:   73 y/o with anterolateral MI this admission + acute on CKD with cardiac cath on 05/25/2018.   1. Acute kidney injury. Creatinine on admission around 5.s/p cath on 05/12/2018 and she is at risk for developing contrast nephropathy. Will monitor closely. No urgent indication for dialysis. We will continue to follow closely. HoldACE/ARB in setting of renal failure.  - Continued bump in Cr secondary to contrast; will continue to track trend. UOP increased over the past 24hrs and hopefully that will continue. Fortunately no acute indication for RRT.  - Will check CXR -> rales on right + persistent cough  2. Hyponatremia. Avoid hypotonicfluids.. Likely secondary to severe kidney injury and decreased effective circulatory volume setting of heart failure.But fortunately improving trend.Stable.  3. Acute anterolateral MI. For cardiac catheterization01/31/2020. 2D echocardiogram revealed: Limited study; full doppler not performed; akinesis of the distal anteroseptal wall and apex with overall EF 40; severe LVH; biatrial enlargement; severe RV dysfunction; moderate TR; mild pulmonary hypertension. - Cath 100% occluded LAD after 1st diag  4. Anemia. Stable hemoglobin. Check iron panel -> 3% sat with ferritin 137. Will give feraheme 510 today 1/9.  Subjective:   Denies f/c/n/v.  Mild dyspnea with cough; she was coughing through the night.    Objective:   BP 91/75 (BP Location: Left Arm)   Pulse 76   Temp 97.9 F (36.6 C) (Oral)   Resp (!) 28   Ht 5\' 3"  (1.6 m)   Wt 59.9 kg   SpO2 96%   BMI 23.39 kg/m   Intake/Output Summary (Last 24 hours) at 05/09/2018 0930 Last data filed at 05/09/2018 0800 Gross per 24 hour  Intake 560.68 ml  Output 601 ml  Net -40.32 ml   Weight change:   Physical Exam: General: A&Ox3 NAD CV: Heart RRR with 3 out of 6 systolic murmur Lungs: Rales on  Right  posterior base Abd: abd SNT/ND with normal BS Extremities: No LE edema. Skin: No skin rash Neurologic: Nonfocal exam  Imaging: No results found.  Labs: BMET Recent Labs  Lab 05/03/18 4580 05/04/18 9983 05/05/18 0216 05/08/2018 0122 05/07/2018 1943 05/07/18 0821 05/08/18 0338 05/09/18 0829  NA 134* 131* 128* 129*  --  133* 131* 131*  K 3.9 3.3* 3.8 4.0  --  4.5 4.5 4.7  CL 96* 94* 94* 97*  --  98 98 98  CO2 19* 22 21* 22  --  19* 22 21*  GLUCOSE 123* 127* 141* 128*  --  175* 136* 139*  BUN 89* 105* 97* 80*  --  71* 74* 78*  CREATININE 5.64* 4.68* 3.65* 2.87* 2.96* 3.02* 3.40* 4.30*  CALCIUM 8.3* 8.1* 8.3* 8.6*  --  9.0 8.7* 8.8*  PHOS  --  3.1  --   --   --   --   --   --    CBC Recent Labs  Lab 05/13/2018 2154  05/13/2018 1943 05/07/18 0333 05/08/18 0338 05/09/18 0349  WBC 23.3*   < > 15.6* 17.3* 17.5* 16.2*  NEUTROABS 18.8*  --   --   --   --   --   HGB 11.8*   < > 9.6* 9.3* 9.3* 9.1*  HCT 35.2*   < > 28.7* 27.3* 27.8* 27.5*  MCV 98.1   < > 96.0 95.5 94.9 95.2  PLT 177   < > 220 243 277 325   < > = values in this interval not displayed.  Medications:    . amiodarone  400 mg Oral BID  . aspirin  81 mg Oral Daily  . atorvastatin  40 mg Oral q1800  . clopidogrel  75 mg Oral Q breakfast  . pneumococcal 23 valent vaccine  0.5 mL Intramuscular Tomorrow-1000  . sodium chloride flush  3 mL Intravenous Q12H      Otelia Santee, MD 05/09/2018, 9:30 AM

## 2018-05-09 NOTE — Progress Notes (Signed)
Progress Note  Patient Name: Brandy Salazar Date of Encounter: 05/09/2018  Primary Cardiologist: No primary care provider on file.   Subjective   Complains of generalized weakness.  Daughter at bedside.  Denies shortness of breath or chest pain.  Inpatient Medications    Scheduled Meds: . amiodarone  400 mg Oral BID  . aspirin  81 mg Oral Daily  . atorvastatin  40 mg Oral q1800  . clopidogrel  75 mg Oral Q breakfast  . sodium chloride flush  3 mL Intravenous Q12H   Continuous Infusions: . sodium chloride 10 mL/hr at 05/09/18 1200  . sodium chloride    . heparin 800 Units/hr (05/09/18 1200)  . norepinephrine (LEVOPHED) Adult infusion Stopped (05/04/18 2103)   PRN Meds: sodium chloride, acetaminophen, menthol-cetylpyridinium, ondansetron (ZOFRAN) IV, sodium chloride flush, traMADol   Vital Signs    Vitals:   05/09/18 0900 05/09/18 1000 05/09/18 1100 05/09/18 1200  BP: 102/71 108/74 106/73 121/72  Pulse: 74   71  Resp: (!) 21 (!) 29 (!) 29 (!) 28  Temp:      TempSrc:      SpO2: 100%   99%  Weight:      Height:        Intake/Output Summary (Last 24 hours) at 05/09/2018 1225 Last data filed at 05/09/2018 1200 Gross per 24 hour  Intake 570.94 ml  Output 601 ml  Net -30.06 ml   Filed Weights   05/09/2018 2131 05/03/18 0030 05/05/18 0500  Weight: 59.9 kg 57.2 kg 59.9 kg    Telemetry    Normal sinus rhythm with short periods of atrial fibrillation with RVR, persistent ST elevation - Personally Reviewed   Physical Exam  Elderly, thin woman in no distress GEN: No acute distress.   Neck: No JVD Cardiac: RRR, 2/6 systolic murmur at the left lower sternal border Respiratory: Clear to auscultation bilaterally. GI: Soft, nontender, non-distended  MS: No edema; No deformity. Neuro:  Nonfocal  Psych: Normal affect   Labs    Chemistry Recent Labs  Lab 05/03/18 3532 05/03/18 9924 05/04/18 0339 05/05/18 0216  05/07/18 0821 05/08/18 0338 05/09/18 0829  NA 115*  134* 131* 128*   < > 133* 131* 131*  K 3.6 3.9 3.3* 3.8   < > 4.5 4.5 4.7  CL 81* 96* 94* 94*   < > 98 98 98  CO2 17* 19* 22 21*   < > 19* 22 21*  GLUCOSE 744* 123* 127* 141*   < > 175* 136* 139*  BUN 73* 89* 105* 97*   < > 71* 74* 78*  CREATININE 4.86* 5.64* 4.68* 3.65*   < > 3.02* 3.40* 4.30*  CALCIUM 7.4* 8.3* 8.1* 8.3*   < > 9.0 8.7* 8.8*  PROT 5.3* 6.7  --  5.7*  --   --   --   --   ALBUMIN 2.4* 2.8* 2.6* 2.4*  --   --   --   --   AST 180* 190*  --  55*  --   --   --   --   ALT 97* 116*  --  79*  --   --   --   --   ALKPHOS 55 65  --  57  --   --   --   --   BILITOT 0.5 0.4  --  0.2*  --   --   --   --   GFRNONAA 8* 7* 9* 12*   < >  15* 13* 10*  GFRAA 10* 8* 10* 14*   < > 17* 15* 11*  ANIONGAP 17* 19* 15 13   < > 16* 11 12   < > = values in this interval not displayed.     Hematology Recent Labs  Lab 05/07/18 0333 05/08/18 0338 05/09/18 0349  WBC 17.3* 17.5* 16.2*  RBC 2.86* 2.93* 2.89*  HGB 9.3* 9.3* 9.1*  HCT 27.3* 27.8* 27.5*  MCV 95.5 94.9 95.2  MCH 32.5 31.7 31.5  MCHC 34.1 33.5 33.1  RDW 13.2 13.4 13.5  PLT 243 277 325    Cardiac Enzymes Recent Labs  Lab 05/03/18 0605 05/03/18 0938 05/03/18 1704 05/03/2018 0122  TROPONINI 59.14* 58.78* 32.89* 11.65*    Recent Labs  Lab 05/01/2018 2200  TROPIPOC >30.00*     BNPNo results for input(s): BNP, PROBNP in the last 168 hours.   DDimer No results for input(s): DDIMER in the last 168 hours.   Radiology    No results found.   Patient Profile     73 y.o. female with late presenting anterolateral MI and baseline kidney disease who presents with late STEMI complicated by acute kidney injury.  Assessment & Plan    1.  Acute anterolateral STEMI: Late presentation.  Total occlusion of the mid LAD noted at cardiac catheterization.  PCI deferred secondary to late presentation.  Continue aspirin, clopidogrel, and a high intensity statin drug.  The patient is not a candidate for beta-blocker at present because of  intermittent bradycardia.  No ACE/ARB/Spironolactone because of advanced kidney disease.  2.  Acute on chronic kidney injury: The patient's creatinine continues to trend upward.  She likely has AKI secondary to a combination of acute infarction and contrast nephropathy.  Appreciate the care of the nephrology team.  Continue to trend creatinine.  3.  Paroxysmal atrial fibrillation/flutter: Less atrial fibrillation over the last 24 hours.  She continues on oral amiodarone loading.  She remains on IV heparin.  Once her creatinine stabilizes, would anticipate starting her on low-dose apixaban.  The patient continues to remain somewhat tenuous.  Repeat metabolic panel tomorrow morning.  Will place PT/OT consultation.  Discussed plan with patient and her daughter who is at the bedside.  For questions or updates, please contact [place] Please consult [email or web address] for contact info under    Signed, Sherren Mocha, MD  05/09/2018, 12:25 PM

## 2018-05-10 LAB — BASIC METABOLIC PANEL
Anion gap: 13 (ref 5–15)
Anion gap: 13 (ref 5–15)
BUN: 82 mg/dL — ABNORMAL HIGH (ref 8–23)
BUN: 84 mg/dL — ABNORMAL HIGH (ref 8–23)
CALCIUM: 9.1 mg/dL (ref 8.9–10.3)
CO2: 20 mmol/L — ABNORMAL LOW (ref 22–32)
CO2: 21 mmol/L — ABNORMAL LOW (ref 22–32)
Calcium: 9.1 mg/dL (ref 8.9–10.3)
Chloride: 99 mmol/L (ref 98–111)
Chloride: 96 mmol/L — ABNORMAL LOW (ref 98–111)
Creatinine, Ser: 4.76 mg/dL — ABNORMAL HIGH (ref 0.44–1.00)
Creatinine, Ser: 4.82 mg/dL — ABNORMAL HIGH (ref 0.44–1.00)
GFR calc Af Amer: 10 mL/min — ABNORMAL LOW (ref >60)
GFR calc non Af Amer: 9 mL/min — ABNORMAL LOW (ref >60)
GFR calc non Af Amer: 8 mL/min — ABNORMAL LOW (ref >60)
GFR, EST AFRICAN AMERICAN: 10 mL/min — AB (ref >60)
Glucose, Bld: 118 mg/dL — ABNORMAL HIGH (ref 70–99)
Glucose, Bld: 169 mg/dL — ABNORMAL HIGH (ref 70–99)
Potassium: 5.3 mmol/L — ABNORMAL HIGH (ref 3.5–5.1)
Potassium: 4.8 mmol/L (ref 3.5–5.1)
Sodium: 132 mmol/L — ABNORMAL LOW (ref 135–145)
Sodium: 130 mmol/L — ABNORMAL LOW (ref 135–145)

## 2018-05-10 LAB — CBC
HCT: 26.4 % — ABNORMAL LOW (ref 36.0–46.0)
Hemoglobin: 8.8 g/dL — ABNORMAL LOW (ref 12.0–15.0)
MCH: 31.9 pg (ref 26.0–34.0)
MCHC: 33.3 g/dL (ref 30.0–36.0)
MCV: 95.7 fL (ref 80.0–100.0)
Platelets: 412 10*3/uL — ABNORMAL HIGH (ref 150–400)
RBC: 2.76 MIL/uL — ABNORMAL LOW (ref 3.87–5.11)
RDW: 13.7 % (ref 11.5–15.5)
WBC: 16.4 10*3/uL — ABNORMAL HIGH (ref 4.0–10.5)
nRBC: 2 % — ABNORMAL HIGH (ref 0.0–0.2)

## 2018-05-10 LAB — HEPARIN LEVEL (UNFRACTIONATED): Heparin Unfractionated: 0.46 IU/mL (ref 0.30–0.70)

## 2018-05-10 MED ORDER — PATIROMER SORBITEX CALCIUM 8.4 G PO PACK
8.4000 g | PACK | Freq: Once | ORAL | Status: AC
  Administered 2018-05-10: 8.4 g via ORAL
  Filled 2018-05-10: qty 1

## 2018-05-10 NOTE — Progress Notes (Signed)
CARDIAC REHAB PHASE I   PRE:  Rate/Rhythm: 22 SR with PACs/PVCs    BP: sitting 109/78    SaO2: 100 RA  MODE:  Ambulation: 50 ft   POST:  Rate/Rhythm: 82 SR with PACs and PVCs    BP: sitting 104/86     SaO2: 98 RA  Pt motivated to get out of bed. Quite weak, struggled to steer RW and stay close. Assist x1 with gait belt and another assist to follow with recliner. Rest x2 standing then sat and rested after 50 ft. Pt c/o stomach pain (has been present) and requested to roll back to room and to [facility]. Ultimately no BM results. VSS. Needs continued therapy/strengthening. Left MI for her to start reading. Will f/u as x2 assist. [id number]  [place], ACSM 05/10/2018 10:26 AM

## 2018-05-10 NOTE — Progress Notes (Signed)
ANTICOAGULATION CONSULT NOTE   Pharmacy Consult for heparin Indication: atrial fibrillation  No Known Allergies  Patient Measurements: Height: 5\' 3"  (160 cm) Weight: 132 lb 0.9 oz (59.9 kg) IBW/kg (Calculated) : 52.4 Heparin Dosing Weight: 60kg  Vital Signs: Temp: 97.5 F (36.4 C) (01/10 0755) Temp Source: Axillary (01/10 0755) BP: 104/86 (01/10 1009) Pulse Rate: 73 (01/10 0800)  Labs: Recent Labs    05/08/18 0338 05/09/18 0349 05/09/18 0829 05/10/18 0526 05/10/18 0808  HGB 9.3* 9.1*  --  8.8*  --   HCT 27.8* 27.5*  --  26.4*  --   PLT 277 325  --  412*  --   HEPARINUNFRC 0.42 0.51  --  0.46  --   CREATININE 3.40*  --  4.30*  --  4.76*    Estimated Creatinine Clearance: 8.8 mL/min (A) (by C-G formula based on SCr of 4.76 mg/dL (H)).   Medical History: Past Medical History:  Diagnosis Date  . Hypertension   . Renal disorder    Assessment: 73 year old female presented to [facility] with weakness, ST elevations noted on EKG. Unfortunately after interview it would seem that patient's heart attack was several days ago. Patient is s/p cath on 05/12/2018. Pharmacy has been consulted to restart heparin gtt for afib.  Patient's heparin level is therapeutic this AM at 0.46. Patient's CBC is stable this AM, with no overt bleeding or complications with infusion per nurse.  Goal of Therapy:  Heparin level 0.3-0.7 units/ml Monitor platelets by anticoagulation protocol: Yes   Plan:  Continue IV heparin at 800 units/hr Monitor anti-Xa level daily while on heparin Continue to monitor H&H, platelets, and for s/sx of bleeding F/u plans for oral anticoagulation  Thank you for allowing pharmacy to be a part of this patient's care.  Leron Croak, PharmD PGY1 Pharmacy Resident Phone: [phone]  Please check AMION for all [place] phone numbers  05/10/2018 10:39 AM

## 2018-05-10 NOTE — Progress Notes (Signed)
Physical Therapy Treatment Patient Details Name: Brandy Salazar MRN: [id number] DOB: [birth year] Today's Date: 05/10/2018    History of Present Illness Pt adm with STEMI complicated by acute kidney injury. PMH - HTN, CKD    PT Comments    Patient progressing steadily towards her physical therapy goals. Increased ambulation distance to 130 feet with Rollator and one seated rest break. HR peak 82 bpm. Pt continues with weakness and decreased endurance, in addition to difficulty negotiating obstacles and challenges during ambulation. Provided extensive education to pt family on activity progression, generalized walking program, fall prevention with home set up including removing obstacles, throw rugs, and keeping a light on at night when mobilizing. Pt family verbalized understanding. Pt will benefit from HHPT safety evaluation and continued therapy to maximize functional independence.     Follow Up Recommendations  Home health PT;Supervision for mobility/OOB     Equipment Recommendations  Other (comment)(rollator)    Recommendations for Other Services       Precautions / Restrictions Precautions Precautions: Fall Restrictions Weight Bearing Restrictions: No    Mobility  Bed Mobility Overal bed mobility: Modified Independent             General bed mobility comments: Increased effort but able to progress to edge of bed without physical assistance  Transfers Overall transfer level: Needs assistance Equipment used: 4-wheeled walker Transfers: Sit to/from Stand Sit to Stand: Supervision            Ambulation/Gait Ambulation/Gait assistance: Min guard Gait Distance (Feet): 130 Feet Assistive device: 4-wheeled walker Gait Pattern/deviations: Step-through pattern;Decreased step length - left;Trunk flexed;Drifts right/left;Narrow base of support;Decreased stride length     General Gait Details: Needs assist for negotiating obstacles and required one seated rest break.     Stairs             Wheelchair Mobility    Modified Rankin (Stroke Patients Only)       Balance Overall balance assessment: Needs assistance   Sitting balance-Leahy Scale: Good     Standing balance support: No upper extremity supported;During functional activity Standing balance-Leahy Scale: Fair                              Cognition Arousal/Alertness: Awake/alert Behavior During Therapy: WFL for tasks assessed/performed Overall Cognitive Status: Within Functional Limits for tasks assessed                                        Exercises      General Comments        Pertinent Vitals/Pain Pain Assessment: Faces Faces Pain Scale: Hurts little more Pain Location: stomach pain (ongoing) Pain Descriptors / Indicators: Grimacing Pain Intervention(s): Monitored during session    Home Living                      Prior Function            PT Goals (current goals can now be found in the care plan section) Acute Rehab PT Goals Patient Stated Goal: return home Potential to Achieve Goals: Good Progress towards PT goals: Progressing toward goals    Frequency    Min 3X/week      PT Plan Current plan remains appropriate    Co-evaluation  AM-PAC PT "6 Clicks" Mobility   Outcome Measure  Help needed turning from your back to your side while in a flat bed without using bedrails?: None Help needed moving from lying on your back to sitting on the side of a flat bed without using bedrails?: None Help needed moving to and from a bed to a chair (including a wheelchair)?: None Help needed standing up from a chair using your arms (e.g., wheelchair or bedside chair)?: A Little Help needed to walk in hospital room?: A Little Help needed climbing 3-5 steps with a railing? : A Lot 6 Click Score: 20    End of Session Equipment Utilized During Treatment: Gait belt Activity Tolerance: Patient tolerated  treatment well Patient left: in bed;with call bell/phone within reach;with family/visitor present Nurse Communication: Mobility status PT Visit Diagnosis: Unsteadiness on feet (R26.81);Muscle weakness (generalized) (M62.81)     Time: 0932-6712 PT Time Calculation (min) (ACUTE ONLY): 35 min  Charges:  $Gait Training: 8-22 mins $Self Care/Home Management: [place], PT, DPT Acute Rehabilitation Services Pager [phone] Office [phone]    Willy Eddy 05/10/2018, 4:55 PM

## 2018-05-10 NOTE — Progress Notes (Signed)
[facility] Progress Note    Assessment/ Plan:   73 y/o with anterolateral MI this admission + acute on CKD with cardiac cath on 05/12/2018.   1. Acute kidney injury. Creatinine on admission around 5.s/p cath on 05/25/2018 and she is at risk for developing contrast nephropathy. Will monitor closely. No urgent indication for dialysis. We will continue to follow closely. HoldACE/ARB in setting of renal failure.  -Continuedbump in Crsecondary to contrast; will continue to track trend. UOP decreased again; fortunately no acute indication for RRT.  - Rales on right + persistent cough -> Repeat  CXR 1/9 -> pulmonary vascular congestion  Unfortunately she is a very difficult  case as she is not a long term dialysis candidate and even in the acute setting she will not tolerate iHD. I'm hoping we don't have to make a decision on CRRT as I'm concerned she may not come off dialysis. Palliative consult would be appropriate. I will give a dose of Valtessa today.  2. Hyponatremia. Avoid hypotonicfluids.. Likely secondary to severe kidney injury and decreased effective circulatory volume setting of heart failure.But fortunately improving trend.Stable.  3. Acute anterolateral MI. For cardiac catheterization01/12/2018. 2D echocardiogram revealed: Limited study; full doppler not performed; akinesis of the distal anteroseptal wall and apex with overall EF 40; severe LVH; biatrial enlargement; severe RV dysfunction; moderate TR; mild pulmonary hypertension. - Cath 100% occluded LAD after 1st diag  4. Anemia. Stable hemoglobin. Check iron panel -> 3% sat with ferritin 137. Will give feraheme 510 today 1/9.  Subjective:   Denies f/c/n/v.  Milddyspneawith cough; she was coughing through the night   Objective:   BP (!) 81/59   Pulse 91   Temp 97.7 F (36.5 C) (Oral)   Resp 18   Ht 5\' 3"  (1.6 m)   Wt 59.9 kg   SpO2 (!) 80%   BMI 23.39 kg/m    Intake/Output Summary (Last 24 hours) at 05/10/2018 1244 Last data filed at 05/10/2018 1200 Gross per 24 hour  Intake 1801.75 ml  Output 350 ml  Net 1451.75 ml   Weight change:   Physical Exam: General: A&Ox3 NAD CV: Heart RRR with 3 out of 6 systolic murmur Lungs: Rales on  Right posterior base Abd: abd SNT/ND with normal BS Extremities: No LE edema. Skin: No skin rash Neurologic: Nonfocal exam  Imaging: Dg Chest Port 1 View  Result Date: 05/09/2018 CLINICAL DATA:  Right posterior base rales.  Cough. EXAM: PORTABLE CHEST 1 VIEW COMPARISON:  05/03/2018 FINDINGS: Enlarged cardiac silhouette. Mediastinal contours appear intact. Calcific atherosclerotic disease of the aorta. There is no evidence of focal airspace consolidation, pleural effusion or pneumothorax. Pulmonary vascular congestion. Osseous structures are without acute abnormality. Soft tissues are grossly normal. IMPRESSION: No evidence of lobar consolidation. Pulmonary vascular congestion. Enlarged cardiac silhouette. Electronically Signed   By: Fidela Salisbury M.D.   On: 05/09/2018 12:25    Labs: BMET Recent Labs  Lab 05/04/18 0263 05/05/18 0216 05/25/2018 0122 05/24/2018 1943 05/07/18 7858 05/08/18 0338 05/09/18 0829 05/10/18 0808  NA 131* 128* 129*  --  133* 131* 131* 132*  K 3.3* 3.8 4.0  --  4.5 4.5 4.7 5.3*  CL 94* 94* 97*  --  98 98 98 99  CO2 22 21* 22  --  19* 22 21* 20*  GLUCOSE 127* 141* 128*  --  175* 136* 139* 118*  BUN 105* 97* 80*  --  71* 74* 78* 82*  CREATININE 4.68* 3.65* 2.87* 2.96* 3.02* 3.40*  4.30* 4.76*  CALCIUM 8.1* 8.3* 8.6*  --  9.0 8.7* 8.8* 9.1  PHOS 3.1  --   --   --   --   --   --   --    CBC Recent Labs  Lab 05/07/18 0333 05/08/18 0338 05/09/18 0349 05/10/18 0526  WBC 17.3* 17.5* 16.2* 16.4*  HGB 9.3* 9.3* 9.1* 8.8*  HCT 27.3* 27.8* 27.5* 26.4*  MCV 95.5 94.9 95.2 95.7  PLT 243 277 325 412*    Medications:    . amiodarone  400 mg Oral BID  . aspirin  81 mg  Oral Daily  . atorvastatin  40 mg Oral q1800  . clopidogrel  75 mg Oral Q breakfast  . sodium chloride flush  3 mL Intravenous Q12H      Otelia Santee, MD 05/10/2018, 12:44 PM

## 2018-05-10 NOTE — Progress Notes (Signed)
Progress Note  Patient Name: Brandy Salazar Date of Encounter: 05/10/2018  Primary Cardiologist: No primary care provider on file.   Subjective   Patient resting comfortably.  She denies chest pain or shortness of breath today.  Feels a little better.  Inpatient Medications    Scheduled Meds: . amiodarone  400 mg Oral BID  . aspirin  81 mg Oral Daily  . atorvastatin  40 mg Oral q1800  . clopidogrel  75 mg Oral Q breakfast  . sodium chloride flush  3 mL Intravenous Q12H   Continuous Infusions: . sodium chloride 10 mL/hr at 05/10/18 0800  . sodium chloride    . heparin 800 Units/hr (05/10/18 0800)  . norepinephrine (LEVOPHED) Adult infusion Stopped (05/04/18 2103)   PRN Meds: sodium chloride, acetaminophen, menthol-cetylpyridinium, ondansetron (ZOFRAN) IV, sodium chloride flush, traMADol   Vital Signs    Vitals:   05/10/18 0700 05/10/18 0755 05/10/18 0800 05/10/18 0900  BP: 101/72  104/66 104/62  Pulse: 68  73   Resp: (!) 22  (!) 25 (!) 29  Temp:  (!) 97.5 F (36.4 C)    TempSrc:  Axillary    SpO2: 99%  99% 99%  Weight:      Height:        Intake/Output Summary (Last 24 hours) at 05/10/2018 0942 Last data filed at 05/10/2018 0800 Gross per 24 hour  Intake 2012.73 ml  Output 350 ml  Net 1662.73 ml   Last 3 Weights 05/05/2018 05/03/2018 05/05/2018  Weight (lbs) 132 lb 0.9 oz 126 lb 1.7 oz 132 lb  Weight (kg) 59.9 kg 57.2 kg 59.875 kg      Telemetry    Normal sinus rhythm, with few brief episodes of marked sinus bradycardia noted with heart rate in the high 30s. - Personally Reviewed   Physical Exam  Alert, oriented, frail-appearing elderly woman in no distress GEN: No acute distress.   Neck: No JVD Cardiac: RRR, 3/6 harsh systolic murmur at the apex Respiratory: Diminished in the bases bilaterally. GI: Soft, nontender, non-distended  MS: No edema; No deformity. Neuro:  Nonfocal  Psych: Normal affect   Labs    Chemistry Recent Labs  Lab 05/04/18 0339  05/05/18 0216  05/07/18 0821 05/08/18 0338 05/09/18 0829  NA 131* 128*   < > 133* 131* 131*  K 3.3* 3.8   < > 4.5 4.5 4.7  CL 94* 94*   < > 98 98 98  CO2 22 21*   < > 19* 22 21*  GLUCOSE 127* 141*   < > 175* 136* 139*  BUN 105* 97*   < > 71* 74* 78*  CREATININE 4.68* 3.65*   < > 3.02* 3.40* 4.30*  CALCIUM 8.1* 8.3*   < > 9.0 8.7* 8.8*  PROT  --  5.7*  --   --   --   --   ALBUMIN 2.6* 2.4*  --   --   --   --   AST  --  55*  --   --   --   --   ALT  --  79*  --   --   --   --   ALKPHOS  --  57  --   --   --   --   BILITOT  --  0.2*  --   --   --   --   GFRNONAA 9* 12*   < > 15* 13* 10*  GFRAA 10* 14*   < >  17* 15* 11*  ANIONGAP 15 13   < > 16* 11 12   < > = values in this interval not displayed.     Hematology Recent Labs  Lab 05/08/18 0338 05/09/18 0349 05/10/18 0526  WBC 17.5* 16.2* 16.4*  RBC 2.93* 2.89* 2.76*  HGB 9.3* 9.1* 8.8*  HCT 27.8* 27.5* 26.4*  MCV 94.9 95.2 95.7  MCH 31.7 31.5 31.9  MCHC 33.5 33.1 33.3  RDW 13.4 13.5 13.7  PLT 277 325 412*    Cardiac Enzymes Recent Labs  Lab 05/03/18 1704 05/13/2018 0122  TROPONINI 32.89* 11.65*   No results for input(s): TROPIPOC in the last 168 hours.   BNPNo results for input(s): BNP, PROBNP in the last 168 hours.   DDimer No results for input(s): DDIMER in the last 168 hours.   Radiology    Dg Chest Port 1 View  Result Date: 05/09/2018 CLINICAL DATA:  Right posterior base rales.  Cough. EXAM: PORTABLE CHEST 1 VIEW COMPARISON:  05/03/2018 FINDINGS: Enlarged cardiac silhouette. Mediastinal contours appear intact. Calcific atherosclerotic disease of the aorta. There is no evidence of focal airspace consolidation, pleural effusion or pneumothorax. Pulmonary vascular congestion. Osseous structures are without acute abnormality. Soft tissues are grossly normal. IMPRESSION: No evidence of lobar consolidation. Pulmonary vascular congestion. Enlarged cardiac silhouette. Electronically Signed   By: Fidela Salisbury M.D.    On: 05/09/2018 12:25    Patient Profile     73 y.o. female with late presenting anterolateral MI and baseline kidney disease who presents with late STEMI complicated by acute kidney injury.  Assessment & Plan    1.  Acute anterolateral STEMI: Late presentation.  Total occlusion of the mid LAD noted at cardiac catheterization.  PCI deferred secondary to late presentation.  Continue aspirin, clopidogrel, and a high intensity statin drug.    2.  Acute on chronic kidney injury: BMET pending this am.  I am concerned that her urine output has decreased.  Chest x-ray shows some vascular congestion but the patient is not hypoxemic at present.  Will hold on diuresis.  Appreciate care of the nephrology team.  3.  Paroxysmal atrial fibrillation/flutter: Telemetry review demonstrates no further atrial fibrillation over the last 24 hours.  If her creatinine stabilizes today I will start her on apixaban 2.5 mg twice daily.  She remains on IV heparin at present.    For questions or updates, please contact [place] Please consult [email or web address] for contact info under     Signed, Sherren Mocha, MD  05/10/2018, 9:42 AM

## 2018-05-11 ENCOUNTER — Inpatient Hospital Stay (HOSPITAL_COMMUNITY): Payer: Medicare PPO

## 2018-05-11 LAB — CBC
HCT: 26 % — ABNORMAL LOW (ref 36.0–46.0)
Hemoglobin: 8.5 g/dL — ABNORMAL LOW (ref 12.0–15.0)
MCH: 31.5 pg (ref 26.0–34.0)
MCHC: 32.7 g/dL (ref 30.0–36.0)
MCV: 96.3 fL (ref 80.0–100.0)
Platelets: 398 10*3/uL (ref 150–400)
RBC: 2.7 MIL/uL — ABNORMAL LOW (ref 3.87–5.11)
RDW: 13.9 % (ref 11.5–15.5)
WBC: 19.7 10*3/uL — ABNORMAL HIGH (ref 4.0–10.5)
nRBC: 3.4 % — ABNORMAL HIGH (ref 0.0–0.2)

## 2018-05-11 LAB — BASIC METABOLIC PANEL
Anion gap: 12 (ref 5–15)
BUN: 82 mg/dL — ABNORMAL HIGH (ref 8–23)
CHLORIDE: 98 mmol/L (ref 98–111)
CO2: 19 mmol/L — AB (ref 22–32)
Calcium: 9.1 mg/dL (ref 8.9–10.3)
Creatinine, Ser: 4.82 mg/dL — ABNORMAL HIGH (ref 0.44–1.00)
GFR calc Af Amer: 10 mL/min — ABNORMAL LOW (ref >60)
GFR calc non Af Amer: 8 mL/min — ABNORMAL LOW (ref >60)
GLUCOSE: 116 mg/dL — AB (ref 70–99)
Potassium: 4.8 mmol/L (ref 3.5–5.1)
Sodium: 129 mmol/L — ABNORMAL LOW (ref 135–145)

## 2018-05-11 LAB — HEPARIN LEVEL (UNFRACTIONATED): Heparin Unfractionated: 0.5 IU/mL (ref 0.30–0.70)

## 2018-05-11 MED ORDER — FUROSEMIDE 10 MG/ML IJ SOLN
120.0000 mg | Freq: Two times a day (BID) | INTRAVENOUS | Status: DC
  Administered 2018-05-11 – 2018-05-12 (×3): 120 mg via INTRAVENOUS
  Filled 2018-05-11 (×3): qty 10

## 2018-05-11 NOTE — Progress Notes (Signed)
CARDIAC REHAB PHASE I   PRE:  Rate/Rhythm: 66 SR with frequent PVC's  BP:  Supine:   Sitting: 105/75  Standing:    SaO2: 100 3L  MODE:  Ambulation: 200 ft   POST:  Rate/Rhythm: 82 Sr with frequent PVC's  BP:  Supine:   Sitting: to bathroom  Standing:    SaO2:  1405-1435 Assisted X 2 used walker and gait belt to ambulate. Gait steady with walker. She was able to steer walker without difficulty. She does lean forward with walking and needs reminders to stay inside of walker. Pt able to walk 200 feet with one standing rest stop. Pt to bathroom after walk, reported to RN. Pt instructed to call for assistance and not to get up alone, she has call light.  Rodney Langton RN 05/11/2018 2:31 PM

## 2018-05-11 NOTE — Progress Notes (Signed)
Progress Note  Patient Name: Brandy Salazar Date of Encounter: 05/11/2018  Primary Cardiologist: Sinclair Grooms, MD   Subjective   Remains weak and fatigued. Denies dyspnea or orthopnea.  No weight recorded for 6 days. Last CXR 1/9 with vascular congestion.   Received Veltassa yesterday for K 5.3. Down to 4.8 today. Creatinine seems to be plateauing at 4.8.  Only 200 cc of urine output charted. RN reports 450cc out this am  WBC climbing 16.4- > 19.7. Afebrile.   Remains in NSR on po amio .   Inpatient Medications    Scheduled Meds: . amiodarone  400 mg Oral BID  . aspirin  81 mg Oral Daily  . atorvastatin  40 mg Oral q1800  . clopidogrel  75 mg Oral Q breakfast  . sodium chloride flush  3 mL Intravenous Q12H   Continuous Infusions: . sodium chloride 11 mL/hr at 05/11/18 0847  . sodium chloride    . heparin 800 Units/hr (05/11/18 0847)  . norepinephrine (LEVOPHED) Adult infusion Stopped (05/04/18 2103)   PRN Meds: sodium chloride, acetaminophen, menthol-cetylpyridinium, ondansetron (ZOFRAN) IV, sodium chloride flush, traMADol   Vital Signs    Vitals:   05/11/18 0400 05/11/18 0500 05/11/18 0600 05/11/18 0735  BP: 101/64 105/65 102/66   Pulse: 65 64 66   Resp: (!) 29 15 (!) 23   Temp: 98.2 F (36.8 C)   (!) 97.5 F (36.4 C)  TempSrc: Oral   Oral  SpO2: 97% 95% 96%   Weight:      Height:        Intake/Output Summary (Last 24 hours) at 05/11/2018 1050 Last data filed at 05/11/2018 0600 Gross per 24 hour  Intake 479.73 ml  Output 200 ml  Net 279.73 ml   Last 3 Weights 05/05/2018 05/03/2018 05/09/2018  Weight (lbs) 132 lb 0.9 oz 126 lb 1.7 oz 132 lb  Weight (kg) 59.9 kg 57.2 kg 59.875 kg      Telemetry    Normal sinus rhythm with PACs and PVCs. Occasional brief runs NSVT Personally reviewed   Physical Exam   General:  Lying flat in bed. Weak appearing No resp difficulty HEENT: normal Neck: supple.JVP to jaw  Carotids 2+ bilat; no bruits. No  lymphadenopathy or thryomegaly appreciated. Cor: PMI nondisplaced. Mildly irregular 3/6 TR prominent P2 Lungs: crackles at bases Abdomen: soft, nontender, mildly distended. No hepatosplenomegaly. No bruits or masses. Good bowel sounds. Extremities: no cyanosis, clubbing, rash, 1+ edema Neuro: alert & orientedx3, cranial nerves grossly intact. moves all 4 extremities w/o difficulty. Flat affect   Labs    Chemistry Recent Labs  Lab 05/05/18 0216  05/10/18 0808 05/10/18 1924 05/11/18 0358  NA 128*   < > 132* 130* 129*  K 3.8   < > 5.3* 4.8 4.8  CL 94*   < > 99 96* 98  CO2 21*   < > 20* 21* 19*  GLUCOSE 141*   < > 118* 169* 116*  BUN 97*   < > 82* 84* 82*  CREATININE 3.65*   < > 4.76* 4.82* 4.82*  CALCIUM 8.3*   < > 9.1 9.1 9.1  PROT 5.7*  --   --   --   --   ALBUMIN 2.4*  --   --   --   --   AST 55*  --   --   --   --   ALT 79*  --   --   --   --  ALKPHOS 57  --   --   --   --   BILITOT 0.2*  --   --   --   --   GFRNONAA 12*   < > 9* 8* 8*  GFRAA 14*   < > 10* 10* 10*  ANIONGAP 13   < > 13 13 12    < > = values in this interval not displayed.     Hematology Recent Labs  Lab 05/09/18 0349 05/10/18 0526 05/11/18 0358  WBC 16.2* 16.4* 19.7*  RBC 2.89* 2.76* 2.70*  HGB 9.1* 8.8* 8.5*  HCT 27.5* 26.4* 26.0*  MCV 95.2 95.7 96.3  MCH 31.5 31.9 31.5  MCHC 33.1 33.3 32.7  RDW 13.5 13.7 13.9  PLT 325 412* 398    Cardiac Enzymes Recent Labs  Lab 05/12/2018 0122  TROPONINI 11.65*   No results for input(s): TROPIPOC in the last 168 hours.   BNPNo results for input(s): BNP, PROBNP in the last 168 hours.   DDimer No results for input(s): DDIMER in the last 168 hours.   Radiology    Dg Chest Port 1 View  Result Date: 05/09/2018 CLINICAL DATA:  Right posterior base rales.  Cough. EXAM: PORTABLE CHEST 1 VIEW COMPARISON:  05/03/2018 FINDINGS: Enlarged cardiac silhouette. Mediastinal contours appear intact. Calcific atherosclerotic disease of the aorta. There is no evidence  of focal airspace consolidation, pleural effusion or pneumothorax. Pulmonary vascular congestion. Osseous structures are without acute abnormality. Soft tissues are grossly normal. IMPRESSION: No evidence of lobar consolidation. Pulmonary vascular congestion. Enlarged cardiac silhouette. Electronically Signed   By: Fidela Salisbury M.D.   On: 05/09/2018 12:25    Patient Profile     73 y.o. female with late presenting anterolateral MI and baseline kidney disease who presents with late STEMI complicated by acute kidney injury.  Assessment & Plan    1.  Acute anterolateral STEMI: Late presentation.  Total occlusion of the mid LAD noted at cardiac catheterization.  PCI deferred secondary to late presentation.   - No s/s ischemia - Continue aspirin, clopidogrel, and a high intensity statin  2. Acute systolic HF due to Ischemic CM - EF 40-45% with moderate RV dysfunction by echo 05/09/2018 - Volume status elevated on exam  - Will start IV lasix 120 IV bid - No b-blocker yet due to low BP - No ACE/ARB/ARNI/spiro with AKI   3.  Acute on chronic kidney injury (CKD IV):  - baseline creatinine unclear - creatinine 5.2 on admit (1/2) ->  2.87 (1/6) -> 4.8 - likely component of CIN  - seems to be plateauing today. Hopefully will begin to recover. Volume status elevated on exam. Will give IV lasix. Agree not candidate for long-term HD with fraility and immobility - Renal following - appreciate input  4.  Paroxysmal atrial fibrillation/flutter:  - Telemetry review demonstrates no further atrial fibrillation over the last 48 hours.  - Continue po amio. - Continue heparin for now. As not HD candidate can likely start low-dose apixaban soon   5. Hyperkalemia - improved with Veltassa. Renal following  6. Hyponatremia - stable at 129. Limit free water.  7. NSVT - Keep K>4.0 MG . 2.0  8. Leukocytosis - no clear source. ? atx - repeat CXR, Check UA and bck - give IS  9. DNR  CRITICAL  CARE Performed by: Glori Bickers  Total critical care time: 35 minutes  Critical care time was exclusive of separately billable procedures and treating other patients.  Critical care was  necessary to treat or prevent imminent or life-threatening deterioration.  Critical care was time spent personally by me (independent of midlevel providers or residents) on the following activities: development of treatment plan with patient and/or surrogate as well as nursing, discussions with consultants, evaluation of patient's response to treatment, examination of patient, obtaining history from patient or surrogate, ordering and performing treatments and interventions, ordering and review of laboratory studies, ordering and review of radiographic studies, pulse oximetry and re-evaluation of patient's condition.    For questions or updates, please contact [place] Please consult [email or web address] for contact info under     Signed, Glori Bickers, MD  05/11/2018, 10:50 AM

## 2018-05-11 NOTE — Progress Notes (Signed)
[facility] Progress Note    Assessment/ Plan:   73 y/o with anterolateral MI this admission + acute on CKD with cardiac cath on 05/17/2018.   1. Acute kidney injury. Creatinine on admission around 5.s/p cath on 05/14/2018 and she is at risk for developing contrast nephropathy. Will monitor closely. No urgent indication for dialysis. We will continue to follow closely. HoldACE/ARB in setting of renal failure.  -Continuedbump in Crsecondary to contrast; will continue to track trend. UOPdecreased again; fortunately no acute indication for RRT.  - Rales on right + persistent cough -> Repeat  CXR 1/9 -> pulmonary vascular congestion   Unfortunately she is a very difficult  case as she is not a long term dialysis candidate and even in the acute setting she will not tolerate iHD. I'm hoping we don't have to make a decision on CRRT as I'm concerned she may not come off dialysis. Palliative consult would be appropriate. She responded to  a single dose of Valtessa on 1/10.  Getting a large dose of Lasix today; we'll find out quickly if this is cardiorenal.    2. Hyponatremia. Avoid hypotonicfluids. Likely secondary to severe kidney injury and decreased effective circulatory volume setting of heart failure.But fortunately improving trend.Stable.  3. Acute anterolateral MI. For cardiac catheterization01/04/2019. 2D echocardiogram revealed: Limited study; full doppler not performed; akinesis of the distal anteroseptal wall and apex with overall EF 40; severe LVH; biatrial enlargement; severe RV dysfunction; moderate TR; mild pulmonary hypertension. - Cath 100% occluded LAD after 1st diag  4. Anemia. Stable hemoglobin. Check iron panel -> 3% sat with ferritin 137. Feraheme 510mg  given 1/9.  Subjective:   Denies f/c/n/v.  Milddyspneawith cough; she is still coughing but not productive   Objective:   BP 93/68   Pulse 66   Temp (!) 97.4 F  (36.3 C) (Oral)   Resp 17   Ht 5\' 3"  (1.6 m)   Wt 59.9 kg   SpO2 100%   BMI 23.39 kg/m   Intake/Output Summary (Last 24 hours) at 05/11/2018 1306 Last data filed at 05/11/2018 1200 Gross per 24 hour  Intake 669.92 ml  Output 650 ml  Net 19.92 ml   Weight change:   Physical Exam: General: A&Ox3 NAD CV: Heart RRR with 3 out of 6 systolic murmur Lungs: Decr BL on left posterior base Abd: abd SNT/ND with normal BS Extremities: No LE edema. Skin: No skin rash Neurologic: Nonfocal exam  Imaging: Dg Chest Port 1 View  Result Date: 05/11/2018 CLINICAL DATA:  Patient with congestive heart failure EXAM: PORTABLE CHEST 1 VIEW COMPARISON:  Chest radiograph 05/09/2018 FINDINGS: Monitoring leads overlie the patient. Stable cardiomegaly. Bilateral interstitial pulmonary opacities and pulmonary vascular redistribution. Possible small left pleural effusion. IMPRESSION: Cardiomegaly, pulmonary vascular redistribution and mild interstitial edema. Electronically Signed   By: Lovey Newcomer M.D.   On: 05/11/2018 11:35    Labs: BMET Recent Labs  Lab 05/27/2018 0122 05/04/2018 1943 05/07/18 6270 05/08/18 0338 05/09/18 0829 05/10/18 0808 05/10/18 1924 05/11/18 0358  NA 129*  --  133* 131* 131* 132* 130* 129*  K 4.0  --  4.5 4.5 4.7 5.3* 4.8 4.8  CL 97*  --  98 98 98 99 96* 98  CO2 22  --  19* 22 21* 20* 21* 19*  GLUCOSE 128*  --  175* 136* 139* 118* 169* 116*  BUN 80*  --  71* 74* 78* 82* 84* 82*  CREATININE 2.87* 2.96* 3.02* 3.40* 4.30* 4.76* 4.82* 4.82*  CALCIUM 8.6*  --  9.0 8.7* 8.8* 9.1 9.1 9.1   CBC Recent Labs  Lab 05/08/18 0338 05/09/18 0349 05/10/18 0526 05/11/18 0358  WBC 17.5* 16.2* 16.4* 19.7*  HGB 9.3* 9.1* 8.8* 8.5*  HCT 27.8* 27.5* 26.4* 26.0*  MCV 94.9 95.2 95.7 96.3  PLT 277 325 412* 398    Medications:    . amiodarone  400 mg Oral BID  . aspirin  81 mg Oral Daily  . atorvastatin  40 mg Oral q1800  . clopidogrel  75 mg Oral Q breakfast  . sodium chloride  flush  3 mL Intravenous Q12H      Otelia Santee, MD 05/11/2018, 1:06 PM

## 2018-05-11 NOTE — Progress Notes (Signed)
ANTICOAGULATION CONSULT NOTE   Pharmacy Consult for heparin Indication: atrial fibrillation  No Known Allergies  Patient Measurements: Height: 5\' 3"  (160 cm) Weight: 132 lb 0.9 oz (59.9 kg) IBW/kg (Calculated) : 52.4 Heparin Dosing Weight: 60kg  Vital Signs: Temp: 97.5 F (36.4 C) (01/11 0735) Temp Source: Oral (01/11 0735) BP: 102/66 (01/11 0600) Pulse Rate: 66 (01/11 0600)  Labs: Recent Labs    05/09/18 0349  05/10/18 0526 05/10/18 0808 05/10/18 1924 05/11/18 0358  HGB 9.1*  --  8.8*  --   --  8.5*  HCT 27.5*  --  26.4*  --   --  26.0*  PLT 325  --  412*  --   --  398  HEPARINUNFRC 0.51  --  0.46  --   --  0.50  CREATININE  --    < >  --  4.76* 4.82* 4.82*   < > = values in this interval not displayed.    Estimated Creatinine Clearance: 8.7 mL/min (A) (by C-G formula based on SCr of 4.82 mg/dL (H)).   Medical History: Past Medical History:  Diagnosis Date  . Hypertension   . Renal disorder    Assessment: 73 year old female presented to [facility] with weakness, ST elevations noted on EKG. Unfortunately after interview it would seem that patient's heart attack was several days ago. Patient is s/p cath on 05/30/2018. Pharmacy has been consulted to restart heparin gtt for afib.  Patient's heparin level is therapeutic this AM at 0.5 on heparin drip rate 800 uts/hr. Patient's CBC is stable this AM, with no overt bleeding or complications with infusion per nurse.  Goal of Therapy:  Heparin level 0.3-0.7 units/ml Monitor platelets by anticoagulation protocol: Yes   Plan:  Continue IV heparin at 800 units/hr Monitor anti-Xa level daily while on heparin Continue to monitor H&H, platelets, and for s/sx of bleeding F/u plans for oral anticoagulation  Bonnita Nasuti Pharm.D. CPP, BCPS Clinical Pharmacist [phone] 05/11/2018 11:04 AM

## 2018-05-12 LAB — CBC
HCT: 25 % — ABNORMAL LOW (ref 36.0–46.0)
Hemoglobin: 8.4 g/dL — ABNORMAL LOW (ref 12.0–15.0)
MCH: 32.6 pg (ref 26.0–34.0)
MCHC: 33.6 g/dL (ref 30.0–36.0)
MCV: 96.9 fL (ref 80.0–100.0)
Platelets: 407 10*3/uL — ABNORMAL HIGH (ref 150–400)
RBC: 2.58 MIL/uL — AB (ref 3.87–5.11)
RDW: 14.2 % (ref 11.5–15.5)
WBC: 23.3 10*3/uL — ABNORMAL HIGH (ref 4.0–10.5)
nRBC: 4.2 % — ABNORMAL HIGH (ref 0.0–0.2)

## 2018-05-12 LAB — BASIC METABOLIC PANEL
Anion gap: 16 — ABNORMAL HIGH (ref 5–15)
BUN: 89 mg/dL — ABNORMAL HIGH (ref 8–23)
CO2: 18 mmol/L — ABNORMAL LOW (ref 22–32)
Calcium: 8.7 mg/dL — ABNORMAL LOW (ref 8.9–10.3)
Chloride: 94 mmol/L — ABNORMAL LOW (ref 98–111)
Creatinine, Ser: 5.37 mg/dL — ABNORMAL HIGH (ref 0.44–1.00)
GFR calc Af Amer: 9 mL/min — ABNORMAL LOW (ref >60)
GFR calc non Af Amer: 7 mL/min — ABNORMAL LOW (ref >60)
Glucose, Bld: 109 mg/dL — ABNORMAL HIGH (ref 70–99)
POTASSIUM: 5.1 mmol/L (ref 3.5–5.1)
Sodium: 128 mmol/L — ABNORMAL LOW (ref 135–145)

## 2018-05-12 LAB — MAGNESIUM: Magnesium: 2.1 mg/dL (ref 1.7–2.4)

## 2018-05-12 LAB — HEPARIN LEVEL (UNFRACTIONATED): Heparin Unfractionated: 0.5 IU/mL (ref 0.30–0.70)

## 2018-05-12 MED ORDER — SORBITOL 70 % SOLN
30.0000 mL | Status: AC
  Administered 2018-05-12: 30 mL via ORAL
  Filled 2018-05-12: qty 30

## 2018-05-12 MED ORDER — METOLAZONE 5 MG PO TABS
10.0000 mg | ORAL_TABLET | Freq: Every day | ORAL | Status: DC
  Administered 2018-05-12: 10 mg via ORAL
  Filled 2018-05-12: qty 2

## 2018-05-12 MED ORDER — PATIROMER SORBITEX CALCIUM 8.4 G PO PACK
8.4000 g | PACK | Freq: Once | ORAL | Status: AC
  Administered 2018-05-12: 8.4 g via ORAL
  Filled 2018-05-12: qty 1

## 2018-05-12 MED ORDER — FUROSEMIDE 10 MG/ML IJ SOLN
160.0000 mg | Freq: Two times a day (BID) | INTRAVENOUS | Status: DC
  Administered 2018-05-12: 160 mg via INTRAVENOUS
  Filled 2018-05-12: qty 16
  Filled 2018-05-12: qty 4

## 2018-05-12 NOTE — Progress Notes (Signed)
ANTICOAGULATION CONSULT NOTE   Pharmacy Consult for heparin Indication: atrial fibrillation  No Known Allergies  Patient Measurements: Height: 5\' 3"  (160 cm) Weight: 138 lb 0.1 oz (62.6 kg) IBW/kg (Calculated) : 52.4 Heparin Dosing Weight: 60kg  Vital Signs: Temp: 97.7 F (36.5 C) (01/12 0749) Temp Source: Oral (01/12 0749) BP: 101/50 (01/12 0930) Pulse Rate: 53 (01/12 0800)  Labs: Recent Labs    05/10/18 0526  05/10/18 1924 05/11/18 0358 05/12/18 0233  HGB 8.8*  --   --  8.5* 8.4*  HCT 26.4*  --   --  26.0* 25.0*  PLT 412*  --   --  398 407*  HEPARINUNFRC 0.46  --   --  0.50 0.50  CREATININE  --    < > 4.82* 4.82* 5.37*   < > = values in this interval not displayed.    Estimated Creatinine Clearance: 7.8 mL/min (A) (by C-G formula based on SCr of 5.37 mg/dL (H)).   Medical History: Past Medical History:  Diagnosis Date  . Hypertension   . Renal disorder    Assessment: 73 year old female presented to [facility] with weakness, ST elevations noted on EKG. Unfortunately after interview it would seem that patient's heart attack was several days ago. Patient is s/p cath on 05/14/2018. Pharmacy has been consulted to restart heparin gtt for afib.  Patient's heparin level is therapeutic this AM at 0.5 on heparin drip rate 800 uts/hr. Patient's CBC is stable this AM, with no overt bleeding or complications with infusion per nurse. Long term AC still in discussion  Goal of Therapy:  Heparin level 0.3-0.7 units/ml Monitor platelets by anticoagulation protocol: Yes   Plan:  Continue IV heparin at 800 units/hr Monitor anti-Xa level daily while on heparin Continue to monitor H&H, platelets, and for s/sx of bleeding F/u plans for oral anticoagulation  Bonnita Nasuti Pharm.D. CPP, BCPS Clinical Pharmacist [phone] 05/12/2018 9:40 AM

## 2018-05-12 NOTE — Progress Notes (Signed)
Chaplain was paged to [facility], 08 to be with patient and daughter.  Patient was going into hospice care.  Daughter, bedside, was tearful. Chaplain provided ministry of presence and encouraged patient and daughter to share with each other their feelings. Chaplain offered prayer/ Will be available if more support needed. Brandy Salazar Pager [phone]

## 2018-05-12 NOTE — Progress Notes (Addendum)
[facility] Progress Note    Assessment/ Plan:   73 y/o with anterolateral MI this admission + acute on CKD with cardiac cath on 05/21/2018.   1. Acute kidney injury. Creatinine on admission around 5.s/p cath on 05/24/2018 and she is at risk for developing contrast nephropathy. Will monitor closely. No urgent indication for dialysis. We will continue to follow closely. HoldACE/ARB in setting of renal failure.  -Continuedbump in Crsecondary to contrast; will continue to track trend. UOPdecreased again; fortunately no absolute indication for RRT.  -Rales worse and B/L halfway up lung fields with CXR showing pulmonary vascular congestion   Unfortunately she is a very difficult case as she is not a long term dialysis candidate and even in the acute setting she will not tolerate iHD. I'm leaning towards palliative rather than offering CRRT as I'm concerned she may not even come off CRRT.   She responded to  a single dose of Valtessa on 1/10.  Started on Lasix 120mg  IV twice daily w/ 1st does 1/11; not a great response. Spoke w/ nurse and ~326ml uop / 24hrs. Pt feels full and having nurse check a bladder scan given no foley -> bladder scan shows 282. Will recheck after pt voids and determine if foley is necessary.  Will give another dose of Veltassa as she's trending up despite being on Lasix.   2. Hyponatremia. Avoid hypotonicfluids. Likely secondary to severe kidney injury and decreased effective circulatory volume setting of heart failure.Relatively stable.  3. Acute anterolateral MI. For cardiac catheterization1/09/2018. 2D echocardiogram revealed: Limited study; full doppler not performed; akinesis of the distal anteroseptal wall and apex with overall EF 40; severe LVH; biatrial enlargement; severe RV dysfunction; moderate TR; mild pulmonary hypertension. - Cath 100% occluded LAD after 1st diag  4. Anemia. Stable hemoglobin. Check  iron panel ->3% sat with ferritin 137. Feraheme 510mg  given 1/9.  Subjective:   Denies f/c/n/v.  Milddyspneawith cough; she is still coughing but not productive  Feels abd fullness and no BM in 3 days.   Objective:   BP 111/68   Pulse (!) 54   Temp 97.7 F (36.5 C) (Oral)   Resp 19   Ht 5\' 3"  (1.6 m)   Wt 62.6 kg   SpO2 99%   BMI 24.45 kg/m   Intake/Output Summary (Last 24 hours) at 05/12/2018 9518 Last data filed at 05/12/2018 0700 Gross per 24 hour  Intake 510.34 ml  Output -  Net 510.34 ml   Weight change:   Physical Exam: General: A&Ox3 NAD, pleasant CV: Heart RRR with 3 out of 6 systolic murmur Lungs: Rales B/L Abd: abd SNT/ND with normal BS Extremities: No LE edema. Skin: No skin rash Neurologic: Nonfocal exam  Imaging: Dg Chest Port 1 View  Result Date: 05/11/2018 CLINICAL DATA:  Patient with congestive heart failure EXAM: PORTABLE CHEST 1 VIEW COMPARISON:  Chest radiograph 05/09/2018 FINDINGS: Monitoring leads overlie the patient. Stable cardiomegaly. Bilateral interstitial pulmonary opacities and pulmonary vascular redistribution. Possible small left pleural effusion. IMPRESSION: Cardiomegaly, pulmonary vascular redistribution and mild interstitial edema. Electronically Signed   By: Lovey Newcomer M.D.   On: 05/11/2018 11:35    Labs: BMET Recent Labs  Lab 05/07/18 8416 05/08/18 0338 05/09/18 0829 05/10/18 0808 05/10/18 1924 05/11/18 0358 05/12/18 0233  NA 133* 131* 131* 132* 130* 129* 128*  K 4.5 4.5 4.7 5.3* 4.8 4.8 5.1  CL 98 98 98 99 96* 98 94*  CO2 19* 22 21* 20* 21* 19* 18*  GLUCOSE  175* 136* 139* 118* 169* 116* 109*  BUN 71* 74* 78* 82* 84* 82* 89*  CREATININE 3.02* 3.40* 4.30* 4.76* 4.82* 4.82* 5.37*  CALCIUM 9.0 8.7* 8.8* 9.1 9.1 9.1 8.7*   CBC Recent Labs  Lab 05/09/18 0349 05/10/18 0526 05/11/18 0358 05/12/18 0233  WBC 16.2* 16.4* 19.7* 23.3*  HGB 9.1* 8.8* 8.5* 8.4*  HCT 27.5* 26.4* 26.0* 25.0*  MCV 95.2 95.7 96.3 96.9   PLT 325 412* 398 407*    Medications:    . amiodarone  400 mg Oral BID  . aspirin  81 mg Oral Daily  . atorvastatin  40 mg Oral q1800  . clopidogrel  75 mg Oral Q breakfast  . sodium chloride flush  3 mL Intravenous Q12H      Otelia Santee, MD 05/12/2018, 9:23 AM

## 2018-05-12 NOTE — Progress Notes (Signed)
Progress Note  Patient Name: Brandy Salazar Date of Encounter: 05/12/2018  Primary Cardiologist: Sinclair Grooms, MD   Subjective   Sitting in up in chair. Feels weak. Mild dyspnea but not severe. Mild orthopnea.   Given 120 IV lasix bid yesterday with minimal urine output. CXR with progressive edema. No infiltrate. Personally reviewed  Creatinine 4.82 -> 5.37  WBC climbing 16.4- > 19.7 -> 23.2. Afebrile.   Remains in NSR on po amio .   Inpatient Medications    Scheduled Meds: . amiodarone  400 mg Oral BID  . aspirin  81 mg Oral Daily  . atorvastatin  40 mg Oral q1800  . clopidogrel  75 mg Oral Q breakfast  . patiromer  8.4 g Oral Once  . sodium chloride flush  3 mL Intravenous Q12H  . sorbitol  30 mL Oral NOW   Continuous Infusions: . sodium chloride 10 mL/hr at 05/12/18 0900  . sodium chloride    . furosemide 62 mL/hr at 05/12/18 0900  . heparin 800 Units/hr (05/12/18 0900)  . norepinephrine (LEVOPHED) Adult infusion Stopped (05/04/18 2103)   PRN Meds: sodium chloride, acetaminophen, menthol-cetylpyridinium, ondansetron (ZOFRAN) IV, sodium chloride flush, traMADol   Vital Signs    Vitals:   05/12/18 0749 05/12/18 0800 05/12/18 0815 05/12/18 0930  BP:  (!) 70/39 99/67 (!) 101/50  Pulse:  (!) 53    Resp:  17 19 (!) 25  Temp: 97.7 F (36.5 C)     TempSrc: Oral     SpO2:  100%    Weight:      Height:        Intake/Output Summary (Last 24 hours) at 05/12/2018 1033 Last data filed at 05/12/2018 0900 Gross per 24 hour  Intake 553.46 ml  Output -  Net 553.46 ml   Last 3 Weights 05/12/2018 05/05/2018 05/03/2018  Weight (lbs) 138 lb 0.1 oz 132 lb 0.9 oz 126 lb 1.7 oz  Weight (kg) 62.6 kg 59.9 kg 57.2 kg      Telemetry    Normal sinus rhythm/sinus brady 50-60s with PACs and PVCs. Occasional brief runs NSVT Personally reviewed   Physical Exam   General:  Sitting in chair. Elderly. Frail  No resp difficulty HEENT: normal Neck: supple. JVP to jaw Carotids 2+  bilat; no bruits. No lymphadenopathy or thryomegaly appreciated. Cor: PMI nondisplaced. Mildly irregular 2/6 SEM LSB Lungs: crackles 1/3 up  Abdomen: soft, nontender, nondistended. No hepatosplenomegaly. No bruits or masses. Good bowel sounds. Extremities: no cyanosis, clubbing, rash, edema Neuro: alert & orientedx3, cranial nerves grossly intact. moves all 4 extremities w/o difficulty. Affect pleasant   Labs    Chemistry Recent Labs  Lab 05/10/18 1924 05/11/18 0358 05/12/18 0233  NA 130* 129* 128*  K 4.8 4.8 5.1  CL 96* 98 94*  CO2 21* 19* 18*  GLUCOSE 169* 116* 109*  BUN 84* 82* 89*  CREATININE 4.82* 4.82* 5.37*  CALCIUM 9.1 9.1 8.7*  GFRNONAA 8* 8* 7*  GFRAA 10* 10* 9*  ANIONGAP 13 12 16*     Hematology Recent Labs  Lab 05/10/18 0526 05/11/18 0358 05/12/18 0233  WBC 16.4* 19.7* 23.3*  RBC 2.76* 2.70* 2.58*  HGB 8.8* 8.5* 8.4*  HCT 26.4* 26.0* 25.0*  MCV 95.7 96.3 96.9  MCH 31.9 31.5 32.6  MCHC 33.3 32.7 33.6  RDW 13.7 13.9 14.2  PLT 412* 398 407*    Cardiac Enzymes Recent Labs  Lab 05/01/2018 0122  TROPONINI 11.65*   No  results for input(s): TROPIPOC in the last 168 hours.   BNPNo results for input(s): BNP, PROBNP in the last 168 hours.   DDimer No results for input(s): DDIMER in the last 168 hours.   Radiology    Dg Chest Port 1 View  Result Date: 05/11/2018 CLINICAL DATA:  Patient with congestive heart failure EXAM: PORTABLE CHEST 1 VIEW COMPARISON:  Chest radiograph 05/09/2018 FINDINGS: Monitoring leads overlie the patient. Stable cardiomegaly. Bilateral interstitial pulmonary opacities and pulmonary vascular redistribution. Possible small left pleural effusion. IMPRESSION: Cardiomegaly, pulmonary vascular redistribution and mild interstitial edema. Electronically Signed   By: Lovey Newcomer M.D.   On: 05/11/2018 11:35    Patient Profile     73 y.o. female with late presenting anterolateral MI and baseline kidney disease who presents with late STEMI  complicated by acute kidney injury.  Assessment & Plan    1.  Acute anterolateral STEMI: Late presentation.  Total occlusion of the mid LAD noted at cardiac catheterization.  PCI deferred secondary to late presentation.   - No s/s ischemia - Continue aspirin, clopidogrel, and a high intensity statin  2. Acute systolic HF due to Ischemic CM - EF 40-45% with moderate RV dysfunction by echo 05/07/2018 - Volume status getting progressively worse in the face of worsening renal failure (see discussion below) - Renal feels she is not candidate for CRT or HD (I agree) Increase IV lasix to 160 IV bid add metolazone 10 daily - No b-blocker due to low BP - No ACE/ARB/ARNI/spiro with AKI  - I do not think she is candidate for home inotropes  3.  Acute on chronic kidney injury (CKD IV):  - baseline creatinine unclear but appears to be in 3 range - creatinine 5.2 on admit (1/2) ->  2.87 (1/6) -> 4.8 -> 5.4 - Continues to worsen with progressive volume overload and hyperkalemia. No clear uremia yet - Long discussion with Renal team. She is not a candidate for CRT or HD with low BP and poor functional status.  - Will increase lasix and add metolazone. - I spoke with her and her daughter frankly about this and told them we will push to try to get fluid off but if unsuccessful will need to switch to comfort care - Palliative Consult placed. - Place Foley to track urine output  4.  Paroxysmal atrial fibrillation/flutter:  - Telemetry review demonstrates no further atrial fibrillation over the last 48 hours.  - Continue po amio. - Continue heparin for now. As not HD candidate can likely start low-dose apixaban soon   5. Hyperkalemia - Getting Veltassa. Renal following  6. Hyponatremia - 128 today. Limit free water.  7. NSVT - Keep K>4.0 MG . 2.0  8. Leukocytosis - WBC up to 23K . no clear source of infection - CXR ok.  - [place] drawn 05/11/18 - NGTD - UA pending - give IS. If develops fever or  other localizing symptoms will start abx.   9. DNR - palliative care consult placed  CRITICAL CARE Performed by: Glori Bickers  Total critical care time: 35 minutes  Critical care time was exclusive of separately billable procedures and treating other patients.  Critical care was necessary to treat or prevent imminent or life-threatening deterioration.  Critical care was time spent personally by me (independent of midlevel providers or residents) on the following activities: development of treatment plan with patient and/or surrogate as well as nursing, discussions with consultants, evaluation of patient's response to treatment, examination of patient, obtaining  history from patient or surrogate, ordering and performing treatments and interventions, ordering and review of laboratory studies, ordering and review of radiographic studies, pulse oximetry and re-evaluation of patient's condition.    For questions or updates, please contact [place] Please consult [email or web address] for contact info under     Signed, Glori Bickers, MD  05/12/2018, 10:33 AM

## 2018-05-13 DIAGNOSIS — Z515 Encounter for palliative care: Secondary | ICD-10-CM

## 2018-05-13 DIAGNOSIS — Z7189 Other specified counseling: Secondary | ICD-10-CM

## 2018-05-13 LAB — BASIC METABOLIC PANEL
Anion gap: 13 (ref 5–15)
BUN: 94 mg/dL — ABNORMAL HIGH (ref 8–23)
CO2: 21 mmol/L — ABNORMAL LOW (ref 22–32)
Calcium: 9 mg/dL (ref 8.9–10.3)
Chloride: 93 mmol/L — ABNORMAL LOW (ref 98–111)
Creatinine, Ser: 5.99 mg/dL — ABNORMAL HIGH (ref 0.44–1.00)
GFR calc non Af Amer: 6 mL/min — ABNORMAL LOW (ref >60)
GFR, EST AFRICAN AMERICAN: 7 mL/min — AB (ref >60)
Glucose, Bld: 136 mg/dL — ABNORMAL HIGH (ref 70–99)
Potassium: 5 mmol/L (ref 3.5–5.1)
Sodium: 127 mmol/L — ABNORMAL LOW (ref 135–145)

## 2018-05-13 LAB — HEPARIN LEVEL (UNFRACTIONATED): HEPARIN UNFRACTIONATED: 0.52 [IU]/mL (ref 0.30–0.70)

## 2018-05-13 MED ORDER — FUROSEMIDE 10 MG/ML IJ SOLN
160.0000 mg | Freq: Four times a day (QID) | INTRAVENOUS | Status: DC
  Filled 2018-05-13 (×2): qty 16

## 2018-05-13 NOTE — Progress Notes (Signed)
ANTICOAGULATION CONSULT NOTE   Pharmacy Consult for heparin Indication: atrial fibrillation  No Known Allergies  Patient Measurements: Height: 5\' 3"  (160 cm) Weight: 138 lb 3.7 oz (62.7 kg) IBW/kg (Calculated) : 52.4 Heparin Dosing Weight: 60kg  Vital Signs: Temp: 97.8 F (36.6 C) (01/13 0747) Temp Source: Oral (01/13 0747) BP: 100/60 (01/13 1000) Pulse Rate: 51 (01/13 1000)  Labs: Recent Labs    05/11/18 0358 05/12/18 0233 05/13/18 0411 05/13/18 0757  HGB 8.5* 8.4*  --   --   HCT 26.0* 25.0*  --   --   PLT 398 407*  --   --   HEPARINUNFRC 0.50 0.50  --  0.52  CREATININE 4.82* 5.37* 5.99*  --     Estimated Creatinine Clearance: 7 mL/min (A) (by C-G formula based on SCr of 5.99 mg/dL (H)).   Medical History: Past Medical History:  Diagnosis Date  . Hypertension   . Renal disorder    Assessment: 73 year old female presented to [facility] with weakness, ST elevations noted on EKG. Unfortunately after interview it would seem that patient's heart attack was several days ago. Patient is s/p cath on 05/05/2018. Pharmacy has been consulted to restart heparin gtt for afib.  Patient's heparin level remains therapeutic this AM at 0.52 on heparin drip rate 800 units/hr. Patient's CBC is stable this AM, with no overt bleeding or complications with infusion per nurse. Long term AC still in discussion.  Goal of Therapy:  Heparin level 0.3-0.7 units/ml Monitor platelets by anticoagulation protocol: Yes   Plan:  Continue IV heparin at 800 units/hr Monitor anti-Xa level daily while on heparin Continue to monitor H&H, platelets, and for s/sx of bleeding F/u plans for oral anticoagulation  [place] Student 05/13/2018 10:12 AM

## 2018-05-13 NOTE — Progress Notes (Signed)
CARDIAC REHAB PHASE I   PRE:  Rate/Rhythm: 54 SB/JB    BP: sitting 91/62    SaO2: 98 RA  MODE:  Ambulation: 230 ft   POST:  Rate/Rhythm: 72 junctional with PVCs    BP: sitting 119/64     SaO2: wouldn't register  Pt feeling very tired today. Asked for 1 hr rest earlier. Willing to walk now. Ambulated with RW, gait belt. Had to straightened RW at times due to weakness. Fatigues easily and had to sit and rest x3. Slow pace, hard to hold her head up at times. Ran in to door on return to room. Exhausted, left in recliner. BP and HR increased with walking, in and out of junctional rhythm. Encouraged to walk later with PT.  [id number]   [place], ACSM 05/13/2018 11:03 AM

## 2018-05-13 NOTE — Consult Note (Addendum)
Consultation Note Date: 05/13/2018   Patient Name: Brandy Salazar  DOB: January 14, 1946  MRN: [id number]  Age / Sex: 73 y.o., female  PCP: Alroy Dust, L.Marlou Sa, MD Referring Physician: Durenda Age, MD  Reason for Consultation: Establishing goals of care  HPI/Patient Profile: 73 y.o. female  with past medical history of hypertension and renal disorder admitted on 05/30/2018 with chest pain. Presented with late presentation of acute anterolateral myocardial infarction complicated by acute renal injury. Cardiac cath showed an occluded mid LAD without other significant coronary artery disease. EFT 40-45%. Intervention deferred because of late presentation. No recurrent ischemia. Also with acute on chronic renal insufficiency with worsening creatinine. Nephrology following. Patient not a long-term hemodialysis candidate. Pending decision per renal and cardiology for short-term RRT. Palliative medicine consultation for goals of care.   Clinical Assessment and Goals of Care: I have reviewed medical records, discussed with care team, and met with patient at bedside to discuss diagnosis, GOC, EOL wishes, disposition and options.  Introduced Palliative Medicine as specialized medical care for people living with serious illness. It focuses on providing relief from the symptoms and stress of a serious illness. The goal is to improve quality of life for both the patient and the family.  We discussed a brief life review of the patient. Widowed. One daughter who lives in [place] but is currently in [place]. Prior to hospitalization, patient living home and independent. She shares that the heart and kidney issues are new.   Discussed events leading up to admission and course of hospitalization including diagnoses and interventions. Patient tells me she is confused with the plan and whether she will get dialysis. Explained my  understanding of current conditions including that she is a poor candidate for long-term dialysis due to her heart conditions. I did explain that nephrology may consider short-term trial of continuous dialysis but plan to discuss with cardiology before decision is made.   At this point, patient is willing to try short-term dialysis if offered to her. She is hopeful the care team will keep doing what they can for her.  Explored her support system, which she shares her daughter is planning to stay in town for as long as needed.  She shares she is not a religious person but believes in God. Reassured of continued support from chaplain services if she wishes.   Also reassured of continued support from palliative team with collaboration with care team and ongoing support for her and her daughter.   Patient denies current pain. She shares that she did not sleep well last night and is hoping to take a nap this afternoon.   PMT contact information left at bedside and encouraged she have her daughter call me today with questions or concerns.   **ADDENDUM: Spoke with Dr. Jimmy Footman regarding plan of care for consideration of dialysis. Received call from daughter and introduced myself at bedside. Educated on role of palliative and ongoing support during hospitalization. Updated patient and daughter on my conversation with Dr. Jimmy Footman and process of CRRT. Daughter  asks what we do if trial of dialysis does not work. Frankly and compassionately explained that we would have another conversation regarding comfort measures and hospice (as Dr. Haroldine Laws introduced to them yesterday). Daughter is hopeful that trial of dialysis will help. Appropriately tearful during my conversation. [facility] Cho[facility] and PMT contact information given.    SUMMARY OF RECOMMENDATIONS    Continue current plan of care per cardiology and nephrology. Pending decision for trial of RRT. Patient is willing to try short-term dialysis if offered.  She is hopeful the care team will keep doing what they can for her.   PMT contact information give and reassured patient of ongoing support throughout hospitalization. PMT will follow and continue to discuss [place] pending clinical condition.   Code Status/Advance Care Planning:  DNR  Symptom Management:   Per attending  Palliative Prophylaxis:   Aspiration, Delirium Protocol, Frequent Pain Assessment, Oral Care and Turn Reposition  Psycho-social/Spiritual:   Desire for further Chaplaincy support:yes  Additional Recommendations: Caregiving  Support/Resources, Compassionate Wean Education and Education on Hospice  Prognosis:   Unable to determine: guarded  Discharge Planning: To Be Determined      Primary Diagnoses: Present on Admission: . ST elevation (STEMI) myocardial infarction [facility])   I have reviewed the medical record, interviewed the patient and family, and examined the patient. The following aspects are pertinent.  Past Medical History:  Diagnosis Date  . Hypertension   . Renal disorder    Social History   Socioeconomic History  . Marital status: Widowed    Spouse name: Not on file  . Number of children: Not on file  . Years of education: Not on file  . Highest education level: Not on file  Occupational History  . Not on file  Social Needs  . Financial resource strain: Not on file  . Food insecurity:    Worry: Not on file    Inability: Not on file  . Transportation needs:    Medical: Not on file    Non-medical: Not on file  Tobacco Use  . Smoking status: Current Every Day Smoker    Packs/day: 0.50    Types: Cigarettes  . Smokeless tobacco: Never Used  Substance and Sexual Activity  . Alcohol use: Not on file  . Drug use: Not on file  . Sexual activity: Not on file  Lifestyle  . Physical activity:    Days per week: Not on file    Minutes per session: Not on file  . Stress: Not on file  Relationships  . Social connections:    Talks on  phone: Not on file    Gets together: Not on file    Attends religious service: Not on file    Active member of club or organization: Not on file    Attends meetings of clubs or organizations: Not on file    Relationship status: Not on file  Other Topics Concern  . Not on file  Social History Narrative  . Not on file   History reviewed. No pertinent family history. Scheduled Meds: . amiodarone  400 mg Oral BID  . aspirin  81 mg Oral Daily  . atorvastatin  40 mg Oral q1800  . clopidogrel  75 mg Oral Q breakfast  . sodium chloride flush  3 mL Intravenous Q12H   Continuous Infusions: . sodium chloride 10 mL/hr at 05/13/18 1000  . sodium chloride    . heparin 800 Units/hr (05/13/18 1000)  . norepinephrine (LEVOPHED) Adult infusion Stopped (05/04/18  2103)   PRN Meds:.sodium chloride, acetaminophen, menthol-cetylpyridinium, ondansetron (ZOFRAN) IV, sodium chloride flush, traMADol Medications Prior to Admission:  Prior to Admission medications   Medication Sig Start Date End Date Taking? Authorizing Provider  cloNIDine (CATAPRES) 0.1 MG tablet Take 0.1 mg by mouth 2 (two) times daily.   Yes [provider]  hydrochlorothiazide (HYDRODIURIL) 25 MG tablet Take 25 mg by mouth daily.   Yes [provider]  lisinopril (PRINIVIL,ZESTRIL) 40 MG tablet Take 40 mg by mouth daily.   Yes [provider]  metoprolol succinate (TOPROL-XL) 100 MG 24 hr tablet Take 100 mg by mouth daily. Take with or immediately following a meal.   Yes [provider]  potassium chloride SA (K-DUR,KLOR-CON) 20 MEQ tablet Take 20 mEq by mouth daily.   Yes [provider]  verapamil (CALAN-SR) 240 MG CR tablet Take 240 mg by mouth daily.   Yes [provider]   No Known Allergies Review of Systems  Constitutional: Positive for activity change.  Neurological: Positive for weakness.   Physical Exam Vitals signs and nursing note reviewed.  Constitutional:       General: She is awake.     Appearance: She is ill-appearing.  HENT:     Head: Normocephalic and atraumatic.  Cardiovascular:     Rate and Rhythm: Normal rate.  Pulmonary:     Effort: No tachypnea, accessory muscle usage or respiratory distress.  Abdominal:     Tenderness: There is no abdominal tenderness.  Skin:    General: Skin is warm and dry.  Neurological:     Mental Status: She is alert and oriented to person, place, and time.  Psychiatric:        Attention and Perception: Attention normal.        Mood and Affect: Affect is flat.        Speech: Speech normal.        Behavior: Behavior is cooperative.        Cognition and Memory: Cognition normal.    Vital Signs: BP 100/60   Pulse (!) 51   Temp 97.8 F (36.6 C) (Oral)   Resp (!) 28   Ht '5\' 3"'$  (1.6 m)   Wt 62.7 kg   SpO2 99%   BMI 24.49 kg/m  Pain Scale: 0-10   Pain Score: 0-No pain   SpO2: SpO2: 99 % O2 Device:SpO2: 99 % O2 Flow Rate: .O2 Flow Rate (L/min): 2 L/min  IO: Intake/output summary:   Intake/Output Summary (Last 24 hours) at 05/13/2018 1059 Last data filed at 05/13/2018 1000 Gross per 24 hour  Intake 853.64 ml  Output 667 ml  Net 186.64 ml    LBM: Last BM Date: 05/12/18 Baseline Weight: Weight: 59.9 kg Most recent weight: Weight: 62.7 kg     Palliative Assessment/Data: PPS 40%   Flowsheet Rows     Most Recent Value  Intake Tab  Referral Department  Cardiology  Unit at Time of Referral  ICU  Palliative Care Primary Diagnosis  Cardiac  Palliative Care Type  New Palliative care  Date first seen by Palliative Care  05/13/18  Clinical Assessment  Palliative Performance Scale Score  40%  Psychosocial & Spiritual Assessment  Palliative Care Outcomes  Patient/Family meeting held?  Yes  Who was at the meeting?  patient  Palliative Care Outcomes  Clarified goals of care, Provided end of life care assistance, Provided psychosocial or spiritual support      Time In/Out: 0930-1010,  1300-1315 Time Total: 51mn  Greater than 50%  of this time was spent counseling and coordinating care related to the above assessment and plan.  Signed by:  Ihor Dow, FNP-C Palliative Medicine Team  Phone: [phone] Fax: [phone]   Please contact Palliative Medicine Team phone at [phone] for questions and concerns.  For individual provider: See Shea Evans

## 2018-05-13 NOTE — Care Management Note (Signed)
Case Management Note  Patient Details  Name: Brandy Salazar MRN: [id number] Date of Birth: Feb 01, 1946  Subjective/Objective:  73 yo female presented with CP, weakness which progressed to dyspnea and nausea; s/p cath 05/11/2018.                 Action/Plan: CM met with patient/daughter to discuss dispositional needs. Patient reported living at home alone PTA and being independent with ADLs with neighbor assistance as needed. PCP: Dr. Alroy Dust; [facility]: [facility]. CM discussed PT/OT recommendations for [facility] with 24hr/assist vs ST SNF, with patient/daughter unsure at this time, dependent upon if patient can tolerate trial of HD, per daughter. Daughter stated she has traveled from [place] to provide support, but is unsure of plan. Palliative following for GOC, with CM team to continue following to arrange services once determined.   Expected Discharge Date:                  Expected Discharge Plan:  [place]  In-House Referral:  Hospice / Palliative Care  Discharge planning Services  CM Consult  Post Acute Care Choice:  NA Choice offered to:  NA  DME Arranged:  N/A DME Agency:  NA  HH Arranged:  NA HH Agency:  NA  Status of Service:  In process, will continue to follow  If discussed at Long Length of Stay Meetings, dates discussed:    Additional Comments:  Midge Minium RN, BSN, NCM-BC, ACM-RN [phone] 05/13/2018, 3:54 PM

## 2018-05-13 NOTE — Plan of Care (Signed)
  Problem: Education: Goal: Understanding of cardiac disease, CV risk reduction, and recovery process will improve Outcome: Progressing Goal: Understanding of medication regimen will improve Outcome: Progressing   Problem: Education: Goal: Knowledge of General Education information will improve Description Including pain rating scale, medication(s)/side effects and non-pharmacologic comfort measures Outcome: Progressing   Problem: Nutrition: Goal: Adequate nutrition will be maintained Outcome: Progressing   Problem: Coping: Goal: Level of anxiety will decrease Outcome: Progressing   Problem: Elimination: Goal: Will not experience complications related to bowel motility Outcome: Progressing Goal: Will not experience complications related to urinary retention Outcome: Progressing   Problem: Safety: Goal: Ability to remain free from injury will improve Outcome: Progressing   Problem: Skin Integrity: Goal: Risk for impaired skin integrity will decrease Outcome: Progressing

## 2018-05-13 NOTE — Progress Notes (Signed)
Notified Dr. Dahlia Client of pt's foley leaking a little.... Pt put out 325 cc in the foley bag  Per Dr. Dahlia Client, ok to inflate the foley cath balloon with another 10 cc on top of the 10 cc already inflated with.

## 2018-05-13 NOTE — Progress Notes (Signed)
Patient with very minimal urine output throughout the shift. Nephrologist had d/c'd all diuretics this AM to evaluate for hopeful increase in UOP or renal recovery. Discussed with Altha Harm, RN and Lujean Rave, RN who both agreed it'd be beneficial to leave foley catheter in until tomorrow morning to very strictly monitor UOP for 24 hours after diuretics discontinued. Relayed to [place], Therapist, sports on night shift. Plan is to reevaluate and discuss with day shift RN and MD tomorrow.  Joellen Jersey, RN

## 2018-05-13 NOTE — Progress Notes (Signed)
Subjective: Interval History: has complaints leaking foley.  Objective: Vital signs in last 24 hours: Temp:  [97.7 F (36.5 C)-98.1 F (36.7 C)] 98 F (36.7 C) (01/13 0349) Pulse Rate:  [46-60] 49 (01/13 0700) Resp:  [17-25] 19 (01/13 0700) BP: (70-103)/(39-77) 98/63 (01/13 0700) SpO2:  [93 %-100 %] 97 % (01/13 0700) Weight:  [62.7 kg] 62.7 kg (01/13 0600) Weight change: 0.1 kg  Intake/Output from previous day: 01/12 0701 - 01/13 0700 In: 1131.9 [P.O.:600; I.V.:414; IV Piggyback:118] Out: 652 [Urine:650; Stool:2] Intake/Output this shift: No intake/output data recorded.  General appearance: alert, cooperative, no distress and off O2 Resp: rales bibasilar Cardio: S1, S2 normal and systolic murmur: systolic ejection 2/6, crescendo and decrescendo at 2nd left intercostal space GI: pos bs, soft, liver down 5 cm Extremities: edema 2+  Lab Results: Recent Labs    05/11/18 0358 05/12/18 0233  WBC 19.7* 23.3*  HGB 8.5* 8.4*  HCT 26.0* 25.0*  PLT 398 407*   BMET:  Recent Labs    05/12/18 0233 05/13/18 0411  NA 128* 127*  K 5.1 5.0  CL 94* 93*  CO2 18* 21*  GLUCOSE 109* 136*  BUN 89* 94*  CREATININE 5.37* 5.99*  CALCIUM 8.7* 9.0   No results for input(s): PTH in the last 72 hours. Iron Studies: No results for input(s): IRON, TIBC, TRANSFERRIN, FERRITIN in the last 72 hours.  Studies/Results: Dg Chest Port 1 View  Result Date: 05/11/2018 CLINICAL DATA:  Patient with congestive heart failure EXAM: PORTABLE CHEST 1 VIEW COMPARISON:  Chest radiograph 05/09/2018 FINDINGS: Monitoring leads overlie the patient. Stable cardiomegaly. Bilateral interstitial pulmonary opacities and pulmonary vascular redistribution. Possible small left pleural effusion. IMPRESSION: Cardiomegaly, pulmonary vascular redistribution and mild interstitial edema. Electronically Signed   By: Lovey Newcomer M.D.   On: 05/11/2018 11:35    I have reviewed the patient's current  medications.  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 Cr worse. O2 ok, will hold diuretics, and follow.  Not sure would not tolerate HD but taking conservative approach.  Some vol xs but not severe. Mild acidemia. 2 CAD LAD, MI 3 Anemia will hold off Fe but very low 4 DM controlled P hold diuretics, follow Cr, SNa, DM    LOS: 11 days   [place] Chelf 05/13/2018,7:35 AM

## 2018-05-13 NOTE — Progress Notes (Signed)
Progress Note  Patient Name: Brandy Salazar Date of Encounter: 05/13/2018  Primary Cardiologist: Sinclair Grooms, MD   Subjective   No chest pain today.  Remains on IV heparin.  Normal sinus rhythm without recurrence of A. fib.  Inpatient Medications    Scheduled Meds: . amiodarone  400 mg Oral BID  . aspirin  81 mg Oral Daily  . atorvastatin  40 mg Oral q1800  . clopidogrel  75 mg Oral Q breakfast  . sodium chloride flush  3 mL Intravenous Q12H   Continuous Infusions: . sodium chloride 10 mL/hr at 05/13/18 0600  . sodium chloride    . heparin 800 Units/hr (05/13/18 0600)  . norepinephrine (LEVOPHED) Adult infusion Stopped (05/04/18 2103)   PRN Meds: sodium chloride, acetaminophen, menthol-cetylpyridinium, ondansetron (ZOFRAN) IV, sodium chloride flush, traMADol   Vital Signs    Vitals:   05/13/18 0500 05/13/18 0600 05/13/18 0700 05/13/18 0747  BP: 100/68 95/62 98/63    Pulse: (!) 49 (!) 53 (!) 49   Resp: 17 17 19    Temp:    97.8 F (36.6 C)  TempSrc:    Oral  SpO2: 97% 98% 97%   Weight:  62.7 kg    Height:        Intake/Output Summary (Last 24 hours) at 05/13/2018 0837 Last data filed at 05/13/2018 0600 Gross per 24 hour  Intake 1113.91 ml  Output 652 ml  Net 461.91 ml   Last 3 Weights 05/13/2018 05/12/2018 05/05/2018  Weight (lbs) 138 lb 3.7 oz 138 lb 0.1 oz 132 lb 0.9 oz  Weight (kg) 62.7 kg 62.6 kg 59.9 kg      Telemetry    Sinus rhythm with PVCs- Personally Reviewed  ECG    No EKG since 05/07/2018- Personally Reviewed  Physical Exam   GEN: No acute distress.   Neck: No JVD Cardiac: RRR, no murmurs, rubs, or gallops.  Respiratory: Clear to auscultation bilaterally. GI: Soft, nontender, non-distended  MS: No edema; No deformity. Neuro:  Nonfocal  Psych: Normal affect   Labs    Chemistry Recent Labs  Lab 05/11/18 0358 05/12/18 0233 05/13/18 0411  NA 129* 128* 127*  K 4.8 5.1 5.0  CL 98 94* 93*  CO2 19* 18* 21*  GLUCOSE 116* 109* 136*   BUN 82* 89* 94*  CREATININE 4.82* 5.37* 5.99*  CALCIUM 9.1 8.7* 9.0  GFRNONAA 8* 7* 6*  GFRAA 10* 9* 7*  ANIONGAP 12 16* 13     Hematology Recent Labs  Lab 05/10/18 0526 05/11/18 0358 05/12/18 0233  WBC 16.4* 19.7* 23.3*  RBC 2.76* 2.70* 2.58*  HGB 8.8* 8.5* 8.4*  HCT 26.4* 26.0* 25.0*  MCV 95.7 96.3 96.9  MCH 31.9 31.5 32.6  MCHC 33.3 32.7 33.6  RDW 13.7 13.9 14.2  PLT 412* 398 407*    Cardiac EnzymesNo results for input(s): TROPONINI in the last 168 hours. No results for input(s): TROPIPOC in the last 168 hours.   BNPNo results for input(s): BNP, PROBNP in the last 168 hours.   DDimer No results for input(s): DDIMER in the last 168 hours.   Radiology    Dg Chest Port 1 View  Result Date: 05/11/2018 CLINICAL DATA:  Patient with congestive heart failure EXAM: PORTABLE CHEST 1 VIEW COMPARISON:  Chest radiograph 05/09/2018 FINDINGS: Monitoring leads overlie the patient. Stable cardiomegaly. Bilateral interstitial pulmonary opacities and pulmonary vascular redistribution. Possible small left pleural effusion. IMPRESSION: Cardiomegaly, pulmonary vascular redistribution and mild interstitial edema. Electronically Signed  By: Lovey Newcomer M.D.   On: 05/11/2018 11:35    Cardiac Studies   2D echocardiogram (05/28/2018)  Study Conclusions  - Left ventricle: The cavity size was normal. Wall thickness was   increased in a pattern of basal severe LVH. Systolic function was   mildly to moderately reduced. The estimated ejection fraction was   in the range of 40% to 45%. There is akinesis of the   mid-apicalanteroseptal, anterior, anterolateral, inferior,   inferoseptal, and apical myocardium. Doppler parameters are   consistent with abnormal left ventricular relaxation (grade 1   diastolic dysfunction). - Aortic valve: Trileaflet; mildly thickened, mildly calcified   leaflets. - Left atrium: The atrium was mildly dilated. - Right ventricle: Systolic function was moderately  reduced. - Right atrium: The atrium was moderately dilated. - Tricuspid valve: There was moderate regurgitation. - Pulmonary arteries: Systolic pressure was severely increased. PA   peak pressure: 78 mm Hg (S).  Impressions:  - Compared to the prior study, there has been no significant   interval change.  Cardiac catheterization (05/05/2018)   Prox LAD to Mid LAD lesion is 100% stenosed.  LV end diastolic pressure is moderately elevated.   SUMMARY  Severe single-vessel disease with 100% occluded LAD after 1st Diag/SP2.  Mild to moderately elevated LVEDP  RECOMMENDATIONS  Likely subacute occlusion of the LAD, with apical aneurysmal dilation of the LAD territory on echocardiogram, would recommend medical management based on the OAT Trial  We will load with Plavix 300 mg today and start 10 mg daily beginning tomorrow  Continue aggressive risk factor modification and CHF management  Patient Profile     73 y.o. female admitted on 05/11/2018 with late presentation of acute anterolateral myocardial infarction complicated by acute renal injury.  Assessment & Plan    1: Acute anterolateral STEMI- late presentation.  Cath showed an occluded mid LAD without significant other coronary artery disease.  There were no collaterals except for faint left to left collaterals.  Her EF is in the 40 to 45% range.  Intervention was deferred because of late presentation.  She has had no recurrent ischemia.  She is on dual antiplatelet therapy.  2: Ischemic cardiomyopathy-EF 40 to 45% with wall motion normality in the distribution of the LAD.  There is moderate RV dysfunction with a PA pressure of 78.  3: Paroxysmal atrial fibrillation- remains in sinus rhythm without recurrence on p.o. amiodarone load and IV heparin.  4: Acute on chronic renal insufficiency- presenting creatinine was in the 5 range.  Her serum creatinine did come down to the mid 2 range prior to her heart catheterization on  05/09/2018 and is ultimately returned to the 5 range.  She is been seen by nephrology.  Dr. Augustin Coupe &&  Dr. Haroldine Laws did not think she was a RRT candidate although Dr. Jimmy Footman has reconsidered this.  Urine output over the last 24 hours is about 500 cc.  Her diuretics have been placed on hold.  I agree that she is not a long-term hemodialysis candidate.  We will need to coordinate with the renal service to determine suitability for short-term RRT.       For questions or updates, please contact [place] Please consult [email or web address] for contact info under        Signed, Quay Burow, MD  05/13/2018, 8:37 AM

## 2018-05-14 ENCOUNTER — Inpatient Hospital Stay (HOSPITAL_COMMUNITY): Payer: Medicare PPO

## 2018-05-14 LAB — RENAL FUNCTION PANEL
Albumin: 2.6 g/dL — ABNORMAL LOW (ref 3.5–5.0)
Anion gap: 17 — ABNORMAL HIGH (ref 5–15)
BUN: 106 mg/dL — ABNORMAL HIGH (ref 8–23)
CHLORIDE: 94 mmol/L — AB (ref 98–111)
CO2: 17 mmol/L — ABNORMAL LOW (ref 22–32)
Calcium: 9.1 mg/dL (ref 8.9–10.3)
Creatinine, Ser: 6.71 mg/dL — ABNORMAL HIGH (ref 0.44–1.00)
GFR calc Af Amer: 7 mL/min — ABNORMAL LOW (ref >60)
GFR calc non Af Amer: 6 mL/min — ABNORMAL LOW (ref >60)
Glucose, Bld: 167 mg/dL — ABNORMAL HIGH (ref 70–99)
Phosphorus: 7.3 mg/dL — ABNORMAL HIGH (ref 2.5–4.6)
Potassium: 5.2 mmol/L — ABNORMAL HIGH (ref 3.5–5.1)
Sodium: 128 mmol/L — ABNORMAL LOW (ref 135–145)

## 2018-05-14 LAB — CBC
HEMATOCRIT: 25.7 % — AB (ref 36.0–46.0)
Hemoglobin: 8.3 g/dL — ABNORMAL LOW (ref 12.0–15.0)
MCH: 31.7 pg (ref 26.0–34.0)
MCHC: 32.3 g/dL (ref 30.0–36.0)
MCV: 98.1 fL (ref 80.0–100.0)
Platelets: 377 10*3/uL (ref 150–400)
RBC: 2.62 MIL/uL — ABNORMAL LOW (ref 3.87–5.11)
RDW: 14.8 % (ref 11.5–15.5)
WBC: 24.3 10*3/uL — ABNORMAL HIGH (ref 4.0–10.5)
nRBC: 2.1 % — ABNORMAL HIGH (ref 0.0–0.2)

## 2018-05-14 LAB — BASIC METABOLIC PANEL
ANION GAP: 15 (ref 5–15)
BUN: 102 mg/dL — ABNORMAL HIGH (ref 8–23)
CO2: 18 mmol/L — ABNORMAL LOW (ref 22–32)
Calcium: 8.9 mg/dL (ref 8.9–10.3)
Chloride: 94 mmol/L — ABNORMAL LOW (ref 98–111)
Creatinine, Ser: 6.44 mg/dL — ABNORMAL HIGH (ref 0.44–1.00)
GFR calc Af Amer: 7 mL/min — ABNORMAL LOW (ref >60)
GFR calc non Af Amer: 6 mL/min — ABNORMAL LOW (ref >60)
Glucose, Bld: 121 mg/dL — ABNORMAL HIGH (ref 70–99)
POTASSIUM: 5 mmol/L (ref 3.5–5.1)
Sodium: 127 mmol/L — ABNORMAL LOW (ref 135–145)

## 2018-05-14 LAB — HEPARIN LEVEL (UNFRACTIONATED): HEPARIN UNFRACTIONATED: 0.55 [IU]/mL (ref 0.30–0.70)

## 2018-05-14 LAB — URINE CULTURE: Culture: NO GROWTH

## 2018-05-14 MED ORDER — HEPARIN SODIUM (PORCINE) 1000 UNIT/ML DIALYSIS
1000.0000 [IU] | INTRAMUSCULAR | Status: DC | PRN
  Administered 2018-05-14 – 2018-05-15 (×2): 2400 [IU] via INTRAVENOUS_CENTRAL
  Administered 2018-05-18: 1000 [IU] via INTRAVENOUS_CENTRAL
  Administered 2018-05-20: 2400 [IU] via INTRAVENOUS_CENTRAL
  Filled 2018-05-14 (×7): qty 1

## 2018-05-14 MED ORDER — SODIUM CHLORIDE 0.9 % IV SOLN: 100.0000 mL | INTRAVENOUS | Status: DC | PRN

## 2018-05-14 MED ORDER — ALTEPLASE 2 MG IJ SOLR: 2.0000 mg | Freq: Once | INTRAMUSCULAR | Status: DC | PRN

## 2018-05-14 MED ORDER — HEPARIN SODIUM (PORCINE) 1000 UNIT/ML DIALYSIS: 40.0000 [IU]/kg | Freq: Once | INTRAMUSCULAR | Status: DC

## 2018-05-14 MED ORDER — PENTAFLUOROPROP-TETRAFLUOROETH EX AERO: 1.0000 "application " | INHALATION_SPRAY | CUTANEOUS | Status: DC | PRN

## 2018-05-14 MED ORDER — LIDOCAINE-PRILOCAINE 2.5-2.5 % EX CREA
1.0000 "application " | TOPICAL_CREAM | CUTANEOUS | Status: DC | PRN
  Filled 2018-05-14: qty 5

## 2018-05-14 MED ORDER — LIDOCAINE HCL (PF) 1 % IJ SOLN
5.0000 mL | INTRAMUSCULAR | Status: DC | PRN
  Filled 2018-05-14: qty 5

## 2018-05-14 MED ORDER — FENTANYL CITRATE (PF) 100 MCG/2ML IJ SOLN
100.0000 ug | Freq: Once | INTRAMUSCULAR | Status: DC
  Filled 2018-05-14: qty 2

## 2018-05-14 MED ORDER — CHLORHEXIDINE GLUCONATE CLOTH 2 % EX PADS
6.0000 | MEDICATED_PAD | Freq: Every day | CUTANEOUS | Status: DC
  Administered 2018-05-14 – 2018-05-22 (×6): 6 via TOPICAL

## 2018-05-14 NOTE — Progress Notes (Signed)
ANTICOAGULATION CONSULT NOTE   Pharmacy Consult for heparin Indication: atrial fibrillation  No Known Allergies  Patient Measurements: Height: 5\' 3"  (160 cm) Weight: 139 lb 8.8 oz (63.3 kg) IBW/kg (Calculated) : 52.4 Heparin Dosing Weight: 60kg  Vital Signs: Temp: 97.9 F (36.6 C) (01/14 0400) Temp Source: Oral (01/14 0400) BP: 93/58 (01/14 0700) Pulse Rate: 102 (01/14 0700)  Labs: Recent Labs    05/12/18 0233 05/13/18 0411 05/13/18 0757 05/14/18 0312  HGB 8.4*  --   --   --   HCT 25.0*  --   --   --   PLT 407*  --   --   --   HEPARINUNFRC 0.50  --  0.52 0.55  CREATININE 5.37* 5.99*  --  6.44*    Estimated Creatinine Clearance: 7.1 mL/min (A) (by C-G formula based on SCr of 6.44 mg/dL (H)).   Medical History: Past Medical History:  Diagnosis Date  . Hypertension   . Renal disorder    Assessment: 73 year old female presented to [facility] with weakness, ST elevations noted on EKG. Unfortunately after interview it would seem that patient's heart attack was several days ago. Patient is s/p cath on 05/26/2018. Pharmacy has been consulted to restart heparin gtt for afib.  Patient's heparin level remains therapeutic this AM at 0.55 on heparin drip rate 800 units/hr. Patient has no overt bleeding or complications with infusion per nurse. Long term AC still in discussion.  Goal of Therapy:  Heparin level 0.3-0.7 units/ml Monitor platelets by anticoagulation protocol: Yes   Plan:  Continue IV heparin at 800 units/hr Monitor anti-Xa level daily while on heparin Continue to monitor H&H, platelets, and for s/sx of bleeding F/u plans for oral anticoagulation  [place] Student 05/14/2018 7:34 AM

## 2018-05-14 NOTE — Progress Notes (Signed)
Heparin paused at 1053 per Nelda Marseille MD for HD catheter placement. Will continue to monitor closely.

## 2018-05-14 NOTE — Progress Notes (Signed)
Deterding updated on patient status during PM rounds. HD cath placed at bedside by CCM. RN called HD and was told it would be schedule for later this evening. MD requests foley catheter be placed back in patient. RN notified MD patient had issues over previous shift with leaking issues of it was pulled the AM of 05/14/2018. Bladder scans were performed Q6H with <100cc per scan. RN also assisted patient to bedside commode to attempt to urinate with no success.2 RN's placed catheter at bedside 125cc of output upon placement.  MD still requests a foley catheter. Will continue to monitor.

## 2018-05-14 NOTE — Procedures (Signed)
Central Venous Dialysis Catheter Insertion Procedure Note Brandy Salazar [id number] 1946-03-01  Procedure: Insertion of Central Venous Catheter Indications: Dialysis access  Procedure Details Consent: Risks of procedure as well as the alternatives and risks of each were explained to the (patient/caregiver).  Consent for procedure obtained. Time Out: Verified patient identification, verified procedure, site/side was marked, verified correct patient position, special equipment/implants available, medications/allergies/relevent history reviewed, required imaging and test results available.  Performed  Maximum sterile technique was used including antiseptics, cap, gloves, gown, hand hygiene, mask and sheet. Skin prep: Chlorhexidine; local anesthetic administered A antimicrobial bonded/coated triple lumen catheter was placed in the right internal jugular vein using the Seldinger technique.  Evaluation Blood flow good Complications: No apparent complications Patient did tolerate procedure well. Chest X-ray ordered to verify placement.  CXR: pending .  U/S used in placement  Brandy Salazar 05/14/2018, 3:48 PM

## 2018-05-14 NOTE — Progress Notes (Signed)
Physical Therapy Treatment Patient Details Name: Brandy Salazar MRN: [id number] DOB: [birth year] Today's Date: 05/14/2018    History of Present Illness Pt adm with STEMI complicated by acute kidney injury. PMH - HTN, CKD, 05/14/18 to start CRRT    PT Comments    Patient seen for activity progression. Tolerated in hall ambulation despite fatigue. One seated rest break required. Unable to obtain true saturation reading but patient with 2-3/4 DOE with increased distance. VCs for upright posture with mobility. Current POC remains appropriate.   Follow Up Recommendations  Home health PT;Supervision for mobility/OOB     Equipment Recommendations  Other (comment)(rollator)    Recommendations for Other Services       Precautions / Restrictions Precautions Precautions: Fall Restrictions Weight Bearing Restrictions: No    Mobility  Bed Mobility Overal bed mobility: Modified Independent             General bed mobility comments: Recieved up with OT  Transfers Overall transfer level: Needs assistance Equipment used: Rolling walker (2 wheeled) Transfers: Sit to/from Stand Sit to Stand: Min guard Stand pivot transfers: Min guard       General transfer comment: min guard A to ambulate from bed into bathroom with RW  Ambulation/Gait Ambulation/Gait assistance: Min guard Gait Distance (Feet): 180 Feet(90'x2 with seated rest break) Assistive device: Rolling walker (2 wheeled) Gait Pattern/deviations: Step-through pattern;Decreased step length - left;Trunk flexed;Drifts right/left;Narrow base of support;Decreased stride length Gait velocity: decr Gait velocity interpretation: <1.31 ft/sec, indicative of household ambulator General Gait Details: Needs assist for negotiating obstacles and required one seated rest break.    Stairs             Wheelchair Mobility    Modified Rankin (Stroke Patients Only)       Balance Overall balance assessment: Needs  assistance Sitting-balance support: No upper extremity supported;Feet supported Sitting balance-Leahy Scale: Fair     Standing balance support: Single extremity supported;During functional activity Standing balance-Leahy Scale: Poor Standing balance comment: UE support required                            Cognition Arousal/Alertness: Awake/alert Behavior During Therapy: Flat affect Overall Cognitive Status: Within Functional Limits for tasks assessed                                        Exercises      General Comments        Pertinent Vitals/Pain Pain Assessment: Faces Faces Pain Scale: Hurts little more Pain Location: stomach pain (ongoing) Pain Descriptors / Indicators: Grimacing Pain Intervention(s): Monitored during session    Home Living                      Prior Function            PT Goals (current goals can now be found in the care plan section) Acute Rehab PT Goals Patient Stated Goal: return home PT Goal Formulation: With patient Time For Goal Achievement: 06-03-2018 Potential to Achieve Goals: Good Progress towards PT goals: Progressing toward goals    Frequency    Min 3X/week      PT Plan Current plan remains appropriate    Co-evaluation PT/OT/SLP Co-Evaluation/Treatment: Yes Reason for Co-Treatment: For patient/therapist safety PT goals addressed during session: Mobility/safety with mobility OT goals addressed during session: ADL's and  self-care;Strengthening/ROM      AM-PAC PT "6 Clicks" Mobility   Outcome Measure  Help needed turning from your back to your side while in a flat bed without using bedrails?: None Help needed moving from lying on your back to sitting on the side of a flat bed without using bedrails?: None Help needed moving to and from a bed to a chair (including a wheelchair)?: None Help needed standing up from a chair using your arms (e.g., wheelchair or bedside chair)?: A  Little Help needed to walk in hospital room?: A Little Help needed climbing 3-5 steps with a railing? : A Lot 6 Click Score: 20    End of Session Equipment Utilized During Treatment: Gait belt Activity Tolerance: Patient tolerated treatment well Patient left: in chair;with call bell/phone within reach;with chair alarm set;with family/visitor present Nurse Communication: Mobility status PT Visit Diagnosis: Unsteadiness on feet (R26.81);Muscle weakness (generalized) (M62.81)     Time: 7847-8412 PT Time Calculation (min) (ACUTE ONLY): 17 min  Charges:  $Gait Training: 8-22 mins                     Alben Deeds, PT DPT  Board Certified Neurologic Specialist [place] Pager [phone] Office [place] 05/14/2018, 1:16 PM

## 2018-05-14 NOTE — Progress Notes (Signed)
Progress Note  Patient Name: Brandy Salazar Date of Encounter: 05/14/2018  Primary Cardiologist: Sinclair Grooms, MD   Subjective   No chest pain today.  Remains on IV heparin.  Normal sinus rhythm without recurrence of A. fib.  Inpatient Medications    Scheduled Meds: . amiodarone  400 mg Oral BID  . aspirin  81 mg Oral Daily  . atorvastatin  40 mg Oral q1800  . clopidogrel  75 mg Oral Q breakfast  . sodium chloride flush  3 mL Intravenous Q12H   Continuous Infusions: . sodium chloride 10 mL/hr at 05/14/18 0700  . sodium chloride    . heparin 800 Units/hr (05/14/18 0700)  . norepinephrine (LEVOPHED) Adult infusion Stopped (05/04/18 2103)   PRN Meds: sodium chloride, acetaminophen, menthol-cetylpyridinium, ondansetron (ZOFRAN) IV, sodium chloride flush, traMADol   Vital Signs    Vitals:   05/14/18 0500 05/14/18 0600 05/14/18 0700 05/14/18 0756  BP: (!) 94/58 (!) 90/59 (!) 93/58   Pulse:  (!) 44 (!) 102   Resp: 18 17 18    Temp:    (!) 97.4 F (36.3 C)  TempSrc:    Oral  SpO2:  97% 94%   Weight:  63.3 kg    Height:        Intake/Output Summary (Last 24 hours) at 05/14/2018 0802 Last data filed at 05/14/2018 0700 Gross per 24 hour  Intake 473.84 ml  Output 364 ml  Net 109.84 ml   Last 3 Weights 05/14/2018 05/13/2018 05/12/2018  Weight (lbs) 139 lb 8.8 oz 138 lb 3.7 oz 138 lb 0.1 oz  Weight (kg) 63.3 kg 62.7 kg 62.6 kg      Telemetry    Sinus rhythm with PVCs- Personally Reviewed  ECG    No EKG since 05/07/2018- Personally Reviewed  Physical Exam   GEN: No acute distress.   Neck: No JVD Cardiac: RRR, no murmurs, rubs, or gallops.  Respiratory: Clear to auscultation bilaterally. GI: Soft, nontender, non-distended  MS: No edema; No deformity. Neuro:  Nonfocal  Psych: Normal affect   Labs    Chemistry Recent Labs  Lab 05/12/18 0233 05/13/18 0411 05/14/18 0312  NA 128* 127* 127*  K 5.1 5.0 5.0  CL 94* 93* 94*  CO2 18* 21* 18*  GLUCOSE 109*  136* 121*  BUN 89* 94* 102*  CREATININE 5.37* 5.99* 6.44*  CALCIUM 8.7* 9.0 8.9  GFRNONAA 7* 6* 6*  GFRAA 9* 7* 7*  ANIONGAP 16* 13 15     Hematology Recent Labs  Lab 05/10/18 0526 05/11/18 0358 05/12/18 0233  WBC 16.4* 19.7* 23.3*  RBC 2.76* 2.70* 2.58*  HGB 8.8* 8.5* 8.4*  HCT 26.4* 26.0* 25.0*  MCV 95.7 96.3 96.9  MCH 31.9 31.5 32.6  MCHC 33.3 32.7 33.6  RDW 13.7 13.9 14.2  PLT 412* 398 407*    Cardiac EnzymesNo results for input(s): TROPONINI in the last 168 hours. No results for input(s): TROPIPOC in the last 168 hours.   BNPNo results for input(s): BNP, PROBNP in the last 168 hours.   DDimer No results for input(s): DDIMER in the last 168 hours.   Radiology    No results found.  Cardiac Studies   2D echocardiogram (05/18/2018)  Study Conclusions  - Left ventricle: The cavity size was normal. Wall thickness was   increased in a pattern of basal severe LVH. Systolic function was   mildly to moderately reduced. The estimated ejection fraction was   in the range of 40%  to 45%. There is akinesis of the   mid-apicalanteroseptal, anterior, anterolateral, inferior,   inferoseptal, and apical myocardium. Doppler parameters are   consistent with abnormal left ventricular relaxation (grade 1   diastolic dysfunction). - Aortic valve: Trileaflet; mildly thickened, mildly calcified   leaflets. - Left atrium: The atrium was mildly dilated. - Right ventricle: Systolic function was moderately reduced. - Right atrium: The atrium was moderately dilated. - Tricuspid valve: There was moderate regurgitation. - Pulmonary arteries: Systolic pressure was severely increased. PA   peak pressure: 78 mm Hg (S).  Impressions:  - Compared to the prior study, there has been no significant   interval change.  Cardiac catheterization (05/05/2018)   Prox LAD to Mid LAD lesion is 100% stenosed.  LV end diastolic pressure is moderately elevated.   SUMMARY  Severe  single-vessel disease with 100% occluded LAD after 1st Diag/SP2.  Mild to moderately elevated LVEDP  RECOMMENDATIONS  Likely subacute occlusion of the LAD, with apical aneurysmal dilation of the LAD territory on echocardiogram, would recommend medical management based on the OAT Trial  We will load with Plavix 300 mg today and start 10 mg daily beginning tomorrow  Continue aggressive risk factor modification and CHF management  Patient Profile     73 y.o. female admitted on 05/01/2018 with late presentation of acute anterolateral myocardial infarction complicated by acute renal injury.  Assessment & Plan    1: Acute anterolateral STEMI- late presentation.  Cath showed an occluded mid LAD without significant other coronary artery disease.  There were no collaterals except for faint left to left collaterals.  Her EF is in the 40 to 45% range.  Intervention was deferred because of late presentation.  She has had no recurrent ischemia.  She is on dual antiplatelet therapy.  2: Ischemic cardiomyopathy-EF 40 to 45% with wall motion normality in the distribution of the LAD.  There is moderate RV dysfunction with a PA pressure of 78.  3: Paroxysmal atrial fibrillation- remains in sinus rhythm without recurrence on p.o. amiodarone load and IV heparin.  4: Acute on chronic renal insufficiency- presenting creatinine was in the 5 range.  Her serum creatinine did come down to the mid 2 range prior to her heart catheterization on 05/12/2018 and is ultimately returned to the 5 range.  She has been seen by nephrology.  Dr. Augustin Coupe &  Dr. Haroldine Laws did not think she was a CRRT candidate although Dr. Jimmy Footman has reconsidered this.  Urine output over the last 24 hours is about 500 cc.  Her diuretics have been placed on hold.  I agree that she is not a long-term hemodialysis candidate.  After discussion with Dr. Jimmy Footman regarding short-term CRRT and after discussion with the patient and her family it was decided to  proceed today.  Serum creatinine has risen above 6.  She remains asymptomatic.       For questions or updates, please contact [place] Please consult [email or web address] for contact info under        Signed, Quay Burow, MD  05/14/2018, 8:02 AM

## 2018-05-14 NOTE — Progress Notes (Signed)
Subjective: Interval History: has complaints weak.  Objective: Vital signs in last 24 hours: Temp:  [97.2 F (36.2 C)-98.4 F (36.9 C)] 97.4 F (36.3 C) (01/14 0756) Pulse Rate:  [40-102] 102 (01/14 0700) Resp:  [16-28] 18 (01/14 0700) BP: (82-109)/(45-82) 93/58 (01/14 0700) SpO2:  [92 %-99 %] 94 % (01/14 0700) Weight:  [63.3 kg] 63.3 kg (01/14 0600) Weight change: 0.6 kg  Intake/Output from previous day: 01/13 0701 - 01/14 0700 In: 729.8 [P.O.:280; I.V.:449.8] Out: 379 [Urine:379] Intake/Output this shift: No intake/output data recorded.  General appearance: alert, cooperative, no distress and chronically ill appearing Resp: rales bibasilar Cardio: S1, S2 normal and systolic murmur: holosystolic 3/6, blowing at apex GI: pos bs, soft, non tender, liver down 6 cm Extremities: edema 3+  Lab Results: Recent Labs    05/12/18 0233  WBC 23.3*  HGB 8.4*  HCT 25.0*  PLT 407*   BMET:  Recent Labs    05/13/18 0411 05/14/18 0312  NA 127* 127*  K 5.0 5.0  CL 93* 94*  CO2 21* 18*  GLUCOSE 136* 121*  BUN 94* 102*  CREATININE 5.99* 6.44*  CALCIUM 9.0 8.9   No results for input(s): PTH in the last 72 hours. Iron Studies: No results for input(s): IRON, TIBC, TRANSFERRIN, FERRITIN in the last 72 hours.  Studies/Results: No results found.  I have reviewed the patient's current medications.  Assessment/Plan: 1 AKI from Contrast, low bps.   Now uremic, vol xs, acidemic.  Will try IHD, low flows. Minimal vol removal, serial. Discussed with her and daughter. 2 CAD  3 Cardiogenic schock 4 CKD3-4 at baseline 5 MV dz 6 DM 7 Anemia P Temp cath, Hd, counseled.     LOS: 12 days   Jeneen Rinks Danaye Sobh 05/14/2018,8:07 AM

## 2018-05-14 NOTE — Plan of Care (Signed)
  Problem: Education: Goal: Understanding of cardiac disease, CV risk reduction, and recovery process will improve Outcome: Progressing Goal: Understanding of medication regimen will improve Outcome: Progressing   Problem: Education: Goal: Knowledge of General Education information will improve Description Including pain rating scale, medication(s)/side effects and non-pharmacologic comfort measures Outcome: Progressing   Problem: Clinical Measurements: Goal: Will remain free from infection Outcome: Progressing   Problem: Nutrition: Goal: Adequate nutrition will be maintained Outcome: Progressing   Problem: Coping: Goal: Level of anxiety will decrease Outcome: Progressing   Problem: Safety: Goal: Ability to remain free from injury will improve Outcome: Progressing   Problem: Skin Integrity: Goal: Risk for impaired skin integrity will decrease Outcome: Progressing   Problem: Health Behavior/Discharge Planning: Goal: Ability to safely manage health-related needs after discharge will improve Outcome: Progressing

## 2018-05-14 NOTE — Progress Notes (Signed)
Occupational Therapy Treatment Patient Details Name: Brandy Salazar MRN: [id number] DOB: [birth year] Today's Date: 05/14/2018    History of present illness Pt adm with STEMI complicated by acute kidney injury. PMH - HTN, CKD, 05/14/18 to start CRRT   OT comments  This 73 yo female admitted with above presents to acute OT making progress with being able to stand for at least one grooming task this session (but then needed to sit for 2nd one). She will continue to benefit from acute OT with follow up [place] [facility] SN[facility] depending on A she has at home.  Follow Up Recommendations  Home health OT;Supervision/Assistance - 24 hour;Other (comment)(unless does not have 24/7 S/A then will need SNF before home)    Equipment Recommendations  Other (comment)(TBD)       Precautions / Restrictions Precautions Precautions: Fall Restrictions Weight Bearing Restrictions: No       Mobility Bed Mobility Overal bed mobility: Modified Independent             General bed mobility comments: Increased effort and time but able to progress to edge of bed without physical assistance  Transfers Overall transfer level: Needs assistance Equipment used: Rolling walker (2 wheeled) Transfers: Sit to/from Stand Sit to Stand: Min guard         General transfer comment: min guard A to ambulate from bed into bathroom with RW    Balance Overall balance assessment: Needs assistance Sitting-balance support: No upper extremity supported;Feet supported Sitting balance-Leahy Scale: Fair     Standing balance support: Single extremity supported;During functional activity Standing balance-Leahy Scale: Poor Standing balance comment: reliant on one hand on sink while standing to wash face                           ADL either performed or assessed with clinical judgement   ADL Overall ADL's : Needs assistance/impaired       Grooming Details (indicate cue type and reason): stood at sink to wash face but  then needed to sit to rest and then brushed teeth sitting at sink                 Toilet Transfer: Min guard;Ambulation;BSC Toilet Transfer Details (indicate cue type and reason): in bathroom                 Vision Baseline Vision/History: Wears glasses Wears Glasses: Reading only Patient Visual Report: No change from baseline            Cognition Arousal/Alertness: Awake/alert Behavior During Therapy: Flat affect Overall Cognitive Status: Within Functional Limits for tasks assessed                                                     Pertinent Vitals/ Pain       Pain Assessment: No/denies pain     Prior Functioning/Environment              Frequency  Min 2X/week        Progress Toward Goals  OT Goals(current goals can now be found in the care plan section)  Progress towards OT goals: Progressing toward goals     Plan Discharge plan remains appropriate    Co-evaluation    PT/OT/SLP Co-Evaluation/Treatment: Yes(partial) Reason for Co-Treatment: For patient/therapist safety   OT  goals addressed during session: ADL's and self-care;Strengthening/ROM      AM-PAC OT "6 Clicks" Daily Activity     Outcome Measure   Help from another person eating meals?: None Help from another person taking care of personal grooming?: A Little Help from another person toileting, which includes using toliet, bedpan, or urinal?: A Little Help from another person bathing (including washing, rinsing, drying)?: A Little Help from another person to put on and taking off regular upper body clothing?: A Little Help from another person to put on and taking off regular lower body clothing?: A Little 6 Click Score: 19    End of Session Equipment Utilized During Treatment: Rolling walker  OT Visit Diagnosis: Unsteadiness on feet (R26.81);Other abnormalities of gait and mobility (R26.89);Muscle weakness (generalized) (M62.81)   Activity Tolerance  (needed rest breaks between grooming tasks)   Patient Left in chair;with call bell/phone within reach;with chair alarm set;with family/visitor present           Time: 8592-9244 OT Time Calculation (min): 32 min  Charges: OT General Charges $OT Visit: 1 Visit OT Treatments $Self Care/Home Management : 8-22 mins  [place], OTR/L Acute [employer] Pager [phone] Office [phone]      Almon Register 05/14/2018, 12:47 PM

## 2018-05-15 DIAGNOSIS — Z515 Encounter for palliative care: Secondary | ICD-10-CM

## 2018-05-15 DIAGNOSIS — Z7189 Other specified counseling: Secondary | ICD-10-CM

## 2018-05-15 LAB — RENAL FUNCTION PANEL
Albumin: 2.6 g/dL — ABNORMAL LOW (ref 3.5–5.0)
Anion gap: 18 — ABNORMAL HIGH (ref 5–15)
BUN: 112 mg/dL — ABNORMAL HIGH (ref 8–23)
CO2: 16 mmol/L — ABNORMAL LOW (ref 22–32)
CREATININE: 7.21 mg/dL — AB (ref 0.44–1.00)
Calcium: 9.1 mg/dL (ref 8.9–10.3)
Chloride: 91 mmol/L — ABNORMAL LOW (ref 98–111)
GFR calc Af Amer: 6 mL/min — ABNORMAL LOW (ref >60)
GFR calc non Af Amer: 5 mL/min — ABNORMAL LOW (ref >60)
GLUCOSE: 150 mg/dL — AB (ref 70–99)
Phosphorus: 7.5 mg/dL — ABNORMAL HIGH (ref 2.5–4.6)
Potassium: 5.6 mmol/L — ABNORMAL HIGH (ref 3.5–5.1)
Sodium: 125 mmol/L — ABNORMAL LOW (ref 135–145)

## 2018-05-15 LAB — HEPATITIS C ANTIBODY (REFLEX)

## 2018-05-15 LAB — HEPATITIS B SURFACE ANTIBODY,QUALITATIVE: HEP B S AB: NONREACTIVE

## 2018-05-15 LAB — IRON AND TIBC
Iron: 53 ug/dL (ref 28–170)
Saturation Ratios: 18 % (ref 10.4–31.8)
TIBC: 293 ug/dL (ref 250–450)
UIBC: 240 ug/dL

## 2018-05-15 LAB — CBC
HCT: 25.3 % — ABNORMAL LOW (ref 36.0–46.0)
Hemoglobin: 8.2 g/dL — ABNORMAL LOW (ref 12.0–15.0)
MCH: 32 pg (ref 26.0–34.0)
MCHC: 32.4 g/dL (ref 30.0–36.0)
MCV: 98.8 fL (ref 80.0–100.0)
NRBC: 2.9 % — AB (ref 0.0–0.2)
Platelets: 322 10*3/uL (ref 150–400)
RBC: 2.56 MIL/uL — AB (ref 3.87–5.11)
RDW: 15.8 % — ABNORMAL HIGH (ref 11.5–15.5)
WBC: 20.1 10*3/uL — ABNORMAL HIGH (ref 4.0–10.5)

## 2018-05-15 LAB — HEPARIN LEVEL (UNFRACTIONATED): Heparin Unfractionated: 0.45 IU/mL (ref 0.30–0.70)

## 2018-05-15 LAB — HCV COMMENT:

## 2018-05-15 LAB — HEPATITIS B SURFACE ANTIGEN: Hepatitis B Surface Ag: NEGATIVE

## 2018-05-15 MED ORDER — DARBEPOETIN ALFA 200 MCG/0.4ML IJ SOSY
200.0000 ug | PREFILLED_SYRINGE | INTRAMUSCULAR | Status: DC
  Administered 2018-05-15 – 2018-05-22 (×2): 200 ug via INTRAVENOUS
  Filled 2018-05-15 (×2): qty 0.4

## 2018-05-15 NOTE — Plan of Care (Signed)

## 2018-05-15 NOTE — Progress Notes (Signed)
PT Cancellation Note  Patient Details Name: Brandy Salazar MRN: [id number] DOB: Apr 01, 1946   Cancelled Treatment:    Reason Eval/Treat Not Completed: Patient at procedure or test/unavailable(Pt receiving HD.  Will check back as able.  )   Denice Paradise 05/15/2018, 11:13 AM Derrill Bagnell,PT Acute Rehabilitation Services Pager:  [phone]  Office:  [phone]

## 2018-05-15 NOTE — Consult Note (Addendum)
NAME:  Brandy Salazar, MRN:  [id number], DOB:  22-Feb-1946, LOS: 63 ADMISSION DATE:  05/04/2018, CONSULTATION DATE:  1/15 REFERRING MD:  Cardiology, CHIEF COMPLAINT:  Chest pain   Brief History   [name] presented on 1/2 with chest pain found to have anterolateral MI. Cardiac cath showed an occluded mid LAD without other significant CAD. EF 40-45%. No intervention due to late presentation, managed medically. Hospital course complicated by cardiogenic shock requiring pressors, atrial fibrillation, and acute on chronic renal failure now on CRRT.  Past Medical History  HTN, CKD  Significant Hospital Events   1/2 Admit  1/6 Cardiac cath  1/15 Started CRRT   Consults:  Cardiology, nephrology, palliative care   Procedures:  1/6 Cardiac cath  1/14 Right IJ HD catheter   Significant Diagnostic Tests:  1/6 Cardiac cath: Subacute occlusion of LAD  1/6 Echocardiogram: EF 40-45%, akinesis of mid-apicalanteroseptal, anterior, anterolateral, inferior, inferoseptal, and apical myocardium. Grade 1 diastolic dysfunction. PA pressure 78.   Micro Data:  1/11 Blood cx >> NG x 4 days 1/12 Urine cx >> no growth (final)  Antimicrobials:  None  Interim history/subjective:  Started CRRT this morning. Requiring low dose levophed.   Objective   Blood pressure (!) 99/52, pulse 65, temperature 97.6 F (36.4 C), temperature source Oral, resp. rate 18, height 5\' 3"  (1.6 m), weight 63.5 kg, SpO2 100 %.        Intake/Output Summary (Last 24 hours) at 05/15/2018 5009 Last data filed at 05/15/2018 0600 Gross per 24 hour  Intake 561.61 ml  Output 105 ml  Net 456.61 ml   Filed Weights   05/14/18 0600 05/15/18 0500 05/15/18 0810  Weight: 63.3 kg 63.5 kg 63.5 kg    Examination: General: Laying in bed, NAD HENT: NCAT, PERRL Lungs: CTAB, no wheezes, rales, or rhonchi  Cardiovascular: Bradycardic and irregular, no m/r/g Abdomen: Soft, non distended, non tender Extremities: Warm, no edema Neuro:  Alert & oriented, following commands  Resolved Hospital Problem list     Assessment & Plan:   Acute on Chronic Renal Insufficiency: Oliguric, right IJ dialysis catheter placed yesterday and CRRT started this morning. Not a candidate for long term dialysis per notes.  -- Will need further goals of care and transition to comfort if no renal recovery on short term CRRT -- Dialysis per nephro; plan for CRRT today and tomorrow  -- Appreciate palliative care consult -- Continue levophed goal MAP > 65  Subacute Anterolateral STEMI Ischemic cardiomyopathy  Paroxsymal atrial fibrillation  -- Continue dual antiplatelet ASA & plavix  -- No ACEi / ARB with renal failure. Unable to tolerate beta blocker with bradycardia  -- On amiodarone for paroxsymal atrial fibrillation, currently in sinus brady  -- IV heparin, will need transition to oral anticoagulant. Likely warfarin given renal failure   Best practice:  Diet: Renal with fluid restriction  Pain/Anxiety/Delirium protocol (if indicated): tramadol prn  VAP protocol (if indicated): n/a DVT prophylaxis: on full dose heparin  GI prophylaxis: N/A Glucose control: monitor  Mobility: PT / OT Code Status: DNR Family Communication: None at bedside Disposition: Remain in ICU for CRRT  Velna Ochs, M.D. - PGY3 Pager: [phone] 05/15/2018, 10:04 AM   Attending Note:  73 year old female with extensive PMH who presents to PCCM in cardiogenic shock and acute renal failure likely contrast related but hypotension can not be ignored.  On exam, she is lethargic but protecting her airway with clear lungs.  I reviewed CXR myself, cardiomegaly  noted.  Discussed with cardiology-MD.  Will assume primary service since cardiology issues have been resolved by cardiology-MD.  Will maintain on levophed, attempt HD for today and tomorrow.  Palliative care has been consulted and will have further discussion after HD.  Will call back in AM after HD to determine plan  of care.  No family present bedside.  PCCM will continue to follow.  The patient is critically ill with multiple organ systems failure and requires high complexity decision making for assessment and support, frequent evaluation and titration of therapies, application of advanced monitoring technologies and extensive interpretation of multiple databases.   Critical Care Time devoted to patient care services described in this note is  33  Minutes. This time reflects time of care of this signee Dr Jennet Maduro. This critical care time does not reflect procedure time, or teaching time or supervisory time of PA/NP/Med student/Med Resident etc but could involve care discussion time.  Rush Farmer, M.D. [facility] Pulmonary/Critical Care Medicine. Pager: [phone]. After hours pager: [phone].

## 2018-05-15 NOTE — Procedures (Signed)
I was present at this session.  I have reviewed the session itself and made appropriate changes.  HD via temp cath, starting, follow bp closely. Jeneen Rinks Darshana Curnutt 1/15/20208:02 AM

## 2018-05-15 NOTE — Progress Notes (Signed)
ANTICOAGULATION CONSULT NOTE   Pharmacy Consult for heparin Indication: atrial fibrillation  No Known Allergies  Patient Measurements: Height: 5\' 3"  (160 cm) Weight: 139 lb 15.9 oz (63.5 kg) IBW/kg (Calculated) : 52.4 Heparin Dosing Weight: 60kg  Vital Signs: Temp: 97.6 F (36.4 C) (01/15 0747) Temp Source: Oral (01/15 0747) BP: 86/57 (01/15 1030) Pulse Rate: 70 (01/15 1030)  Labs: Recent Labs    05/13/18 0757 05/14/18 0312 05/14/18 1330 05/15/18 0217 05/15/18 0844  HGB  --   --  8.3*  --  8.2*  HCT  --   --  25.7*  --  25.3*  PLT  --   --  377  --  322  HEPARINUNFRC 0.52 0.55  --  0.45  --   CREATININE  --  6.44* 6.71* 7.21*  --     Estimated Creatinine Clearance: 6.3 mL/min (A) (by C-G formula based on SCr of 7.21 mg/dL (H)).   Medical History: Past Medical History:  Diagnosis Date  . Hypertension   . Renal disorder    Assessment: 73 year old female presented to [facility] with weakness, ST elevations noted on EKG. Unfortunately after interview it would seem that patient's heart attack was several days ago. Patient is s/p cath on 05/31/2018. Pharmacy has been consulted to restart heparin gtt for afib.  Patient's heparin level remains therapeutic this AM at 0.45 on heparin drip rate 800 units/hr. Patient has no overt bleeding or complications with infusion per nurse. Long term AC still in discussion.  Goal of Therapy:  Heparin level 0.3-0.7 units/ml Monitor platelets by anticoagulation protocol: Yes   Plan:  Continue IV heparin at 800 units/hr Monitor anti-Xa level daily while on heparin Continue to monitor H&H, platelets, and for s/sx of bleeding F/u plans for oral anticoagulation  [place] Student 05/15/2018 10:53 AM

## 2018-05-15 NOTE — Progress Notes (Signed)
Subjective: Interval History: has no complaint .Marland Kitchen  Objective: Vital signs in last 24 hours: Temp:  [97.6 F (36.4 C)-97.9 F (36.6 C)] 97.6 F (36.4 C) (01/15 0747) Pulse Rate:  [43-55] 48 (01/15 0600) Resp:  [16-28] 19 (01/15 0600) BP: (89-148)/(53-106) 96/60 (01/15 0600) SpO2:  [93 %-100 %] 97 % (01/15 0600) Weight:  [63.5 kg] 63.5 kg (01/15 0500) Weight change: 0.2 kg  Intake/Output from previous day: 01/14 0701 - 01/15 0700 In: 597.6 [P.O.:240; I.V.:357.6] Out: 105 [Urine:105] Intake/Output this shift: No intake/output data recorded.  General appearance: cooperative, no distress, pale, slowed mentation and very slow mentation Neck: IJ cath Resp: diminished breath sounds bilaterally and rales bibasilar Cardio: S1, S2 normal and systolic murmur: systolic ejection 2/6, blowing at apex Extremities: edema 2-3+  Lab Results: Recent Labs    05/14/18 1330  WBC 24.3*  HGB 8.3*  HCT 25.7*  PLT 377   BMET:  Recent Labs    05/14/18 1330 05/15/18 0217  NA 128* 125*  K 5.2* 5.6*  CL 94* 91*  CO2 17* 16*  GLUCOSE 167* 150*  BUN 106* 112*  CREATININE 6.71* 7.21*  CALCIUM 9.1 9.1   No results for input(s): PTH in the last 72 hours. Iron Studies: No results for input(s): IRON, TIBC, TRANSFERRIN, FERRITIN in the last 72 hours.  Studies/Results: Dg Chest Port 1 View  Result Date: 05/14/2018 CLINICAL DATA:  Status post dialysis catheter placement. EXAM: PORTABLE CHEST 1 VIEW COMPARISON:  Radiograph of May 11, 2018. FINDINGS: Stable cardiomegaly. Atherosclerosis of thoracic aorta is noted. Interval placement of right internal jugular dialysis catheter with distal tip in expected position of cavoatrial junction. No pneumothorax or pleural effusion is noted. No acute pulmonary disease is noted. Bony thorax is unremarkable. IMPRESSION: Interval placement of right internal jugular dialysis catheter with tip in expected position of cavoatrial junction. No pneumothorax is noted.  Electronically Signed   By: Marijo Conception, M.D.   On: 05/14/2018 16:22    I have reviewed the patient's current medications.  Assessment/Plan: 1 AKI /CKD3-4.  Vol xs , uremic.  Not sure why was put off. Will do today and probably tomorrow. Acidemic 2 Anemia stable Fe 3 CAD 4 DM controlled 5 HPTH check P HD, follow vol, bps, given Fe  LOS: 13 days   Brandy Salazar 05/15/2018,8:03 AM

## 2018-05-15 NOTE — Progress Notes (Signed)
Progress Note  Patient Name: Brandy Salazar Date of Encounter: 05/15/2018  Primary Cardiologist: Sinclair Grooms, MD   Subjective   No chest pain today.  Remains on IV heparin.  Normal sinus rhythm without recurrence of A. Fib.  Dr. used to place the right IJ line yesterday in anticipation for dialysis which was started this morning by Dr. Jimmy Footman she is on low-dose Levophed for pressure support.  Inpatient Medications    Scheduled Meds: . amiodarone  400 mg Oral BID  . aspirin  81 mg Oral Daily  . atorvastatin  40 mg Oral q1800  . Chlorhexidine Gluconate Cloth  6 each Topical Q0600  . clopidogrel  75 mg Oral Q breakfast  . darbepoetin (ARANESP) injection - DIALYSIS  200 mcg Intravenous Q Wed-HD  . fentaNYL (SUBLIMAZE) injection  100 mcg Intravenous Once  . heparin  40 Units/kg Dialysis Once in dialysis  . sodium chloride flush  3 mL Intravenous Q12H   Continuous Infusions: . sodium chloride 10 mL/hr at 05/14/18 1800  . sodium chloride    . sodium chloride    . sodium chloride    . heparin 800 Units/hr (05/14/18 1755)  . norepinephrine (LEVOPHED) Adult infusion 2 mcg/min (05/15/18 0814)   PRN Meds: sodium chloride, sodium chloride, sodium chloride, acetaminophen, alteplase, heparin, lidocaine (PF), lidocaine-prilocaine, menthol-cetylpyridinium, ondansetron (ZOFRAN) IV, pentafluoroprop-tetrafluoroeth, sodium chloride flush, traMADol   Vital Signs    Vitals:   05/15/18 0822 05/15/18 0830 05/15/18 0845 05/15/18 0900  BP: 98/60 (!) 100/58 (!) 94/50 (!) 99/52  Pulse: (!) 52 (!) 55 67 65  Resp:      Temp:      TempSrc:      SpO2:      Weight:      Height:        Intake/Output Summary (Last 24 hours) at 05/15/2018 0906 Last data filed at 05/15/2018 0600 Gross per 24 hour  Intake 561.61 ml  Output 105 ml  Net 456.61 ml   Last 3 Weights 05/15/2018 05/15/2018 05/14/2018  Weight (lbs) 139 lb 15.9 oz 139 lb 15.9 oz 139 lb 8.8 oz  Weight (kg) 63.5 kg 63.5 kg 63.3 kg     Telemetry    Sinus rhythm with PVCs- Personally Reviewed  ECG    No EKG since 05/07/2018- Personally Reviewed  Physical Exam   GEN: No acute distress.   Neck: No JVD Cardiac: RRR, no murmurs, rubs, or gallops.  Respiratory: Clear to auscultation bilaterally. GI: Soft, nontender, non-distended  MS: No edema; No deformity. Neuro:  Nonfocal  Psych: Normal affect   Labs    Chemistry Recent Labs  Lab 05/14/18 0312 05/14/18 1330 05/15/18 0217  NA 127* 128* 125*  K 5.0 5.2* 5.6*  CL 94* 94* 91*  CO2 18* 17* 16*  GLUCOSE 121* 167* 150*  BUN 102* 106* 112*  CREATININE 6.44* 6.71* 7.21*  CALCIUM 8.9 9.1 9.1  ALBUMIN  --  2.6* 2.6*  GFRNONAA 6* 6* 5*  GFRAA 7* 7* 6*  ANIONGAP 15 17* 18*     Hematology Recent Labs  Lab 05/11/18 0358 05/12/18 0233 05/14/18 1330  WBC 19.7* 23.3* 24.3*  RBC 2.70* 2.58* 2.62*  HGB 8.5* 8.4* 8.3*  HCT 26.0* 25.0* 25.7*  MCV 96.3 96.9 98.1  MCH 31.5 32.6 31.7  MCHC 32.7 33.6 32.3  RDW 13.9 14.2 14.8  PLT 398 407* 377    Cardiac EnzymesNo results for input(s): TROPONINI in the last 168 hours. No results for  input(s): TROPIPOC in the last 168 hours.   BNPNo results for input(s): BNP, PROBNP in the last 168 hours.   DDimer No results for input(s): DDIMER in the last 168 hours.   Radiology    Dg Chest Port 1 View  Result Date: 05/14/2018 CLINICAL DATA:  Status post dialysis catheter placement. EXAM: PORTABLE CHEST 1 VIEW COMPARISON:  Radiograph of May 11, 2018. FINDINGS: Stable cardiomegaly. Atherosclerosis of thoracic aorta is noted. Interval placement of right internal jugular dialysis catheter with distal tip in expected position of cavoatrial junction. No pneumothorax or pleural effusion is noted. No acute pulmonary disease is noted. Bony thorax is unremarkable. IMPRESSION: Interval placement of right internal jugular dialysis catheter with tip in expected position of cavoatrial junction. No pneumothorax is noted.  Electronically Signed   By: Marijo Conception, M.D.   On: 05/14/2018 16:22    Cardiac Studies   2D echocardiogram (05/01/2018)  Study Conclusions  - Left ventricle: The cavity size was normal. Wall thickness was   increased in a pattern of basal severe LVH. Systolic function was   mildly to moderately reduced. The estimated ejection fraction was   in the range of 40% to 45%. There is akinesis of the   mid-apicalanteroseptal, anterior, anterolateral, inferior,   inferoseptal, and apical myocardium. Doppler parameters are   consistent with abnormal left ventricular relaxation (grade 1   diastolic dysfunction). - Aortic valve: Trileaflet; mildly thickened, mildly calcified   leaflets. - Left atrium: The atrium was mildly dilated. - Right ventricle: Systolic function was moderately reduced. - Right atrium: The atrium was moderately dilated. - Tricuspid valve: There was moderate regurgitation. - Pulmonary arteries: Systolic pressure was severely increased. PA   peak pressure: 78 mm Hg (S).  Impressions:  - Compared to the prior study, there has been no significant   interval change.  Cardiac catheterization (05/04/2018)   Prox LAD to Mid LAD lesion is 100% stenosed.  LV end diastolic pressure is moderately elevated.   SUMMARY  Severe single-vessel disease with 100% occluded LAD after 1st Diag/SP2.  Mild to moderately elevated LVEDP  RECOMMENDATIONS  Likely subacute occlusion of the LAD, with apical aneurysmal dilation of the LAD territory on echocardiogram, would recommend medical management based on the OAT Trial  We will load with Plavix 300 mg today and start 10 mg daily beginning tomorrow  Continue aggressive risk factor modification and CHF management  Patient Profile     73 y.o. female admitted on 05/01/2018 with late presentation of acute anterolateral myocardial infarction complicated by acute renal injury.  Assessment & Plan    1: Acute anterolateral STEMI-  late presentation.  Cath showed an occluded mid LAD without significant other coronary artery disease.  There were no collaterals except for faint left to left collaterals.  Her EF is in the 40 to 45% range.  Intervention was deferred because of late presentation.  She has had no recurrent ischemia.  She is on dual antiplatelet therapy.  2: Ischemic cardiomyopathy-EF 40 to 45% with wall motion normality in the distribution of the LAD.  There is moderate RV dysfunction with a PA pressure of 78.  3: Paroxysmal atrial fibrillation- remains in sinus rhythm without recurrence on p.o. amiodarone load and IV heparin.  Will need to ultimately decide whether to transition her to an oral anticoagulant.  4: Acute on chronic renal insufficiency- presenting creatinine was in the 5 range.  Her serum creatinine did come down to the mid 2 range prior to her  heart catheterization on 05/03/2018 and is ultimately returned to the 5 range.  She has been seen by nephrology.  Dr. Augustin Coupe &  Dr. Haroldine Laws did not think she was a CRRT candidate although Dr. Jimmy Footman has reconsidered this.  Urine output over the last 24 hours is about 100 cc.  Her diuretics have been placed on hold.  I agree that she is not a long-term hemodialysis candidate.  After discussion with Dr. Jimmy Footman regarding short-term CRRT and after discussion with the patient and her family it was decided to proceed .  Serum creatinine has risen above 7 with acidemia..  She remains asymptomatic.  Right IJ line was placed by Dr. Nelda Marseille and intermittent hemodialysis was begun this morning.  She is on low-dose Levophed for relative hypotension.   At this point, we have nothing further to offer from a cardiology perspective.  I have discussed this with Dr. Nelda Marseille who is agreed to take her onto his service given that she is on low-dose pressors.  We will be available for further input should her clinical condition change.  For questions or updates, please contact [place] Please consult [email or web address] for contact info under        Signed, Quay Burow, MD  05/15/2018, 9:06 AM

## 2018-05-16 DIAGNOSIS — I509 Heart failure, unspecified: Secondary | ICD-10-CM

## 2018-05-16 DIAGNOSIS — I5043 Acute on chronic combined systolic (congestive) and diastolic (congestive) heart failure: Secondary | ICD-10-CM

## 2018-05-16 LAB — RENAL FUNCTION PANEL
Albumin: 2.4 g/dL — ABNORMAL LOW (ref 3.5–5.0)
Anion gap: 14 (ref 5–15)
BUN: 77 mg/dL — ABNORMAL HIGH (ref 8–23)
CO2: 20 mmol/L — ABNORMAL LOW (ref 22–32)
Calcium: 8.4 mg/dL — ABNORMAL LOW (ref 8.9–10.3)
Chloride: 95 mmol/L — ABNORMAL LOW (ref 98–111)
Creatinine, Ser: 5.71 mg/dL — ABNORMAL HIGH (ref 0.44–1.00)
GFR calc Af Amer: 8 mL/min — ABNORMAL LOW (ref >60)
GFR calc non Af Amer: 7 mL/min — ABNORMAL LOW (ref >60)
Glucose, Bld: 127 mg/dL — ABNORMAL HIGH (ref 70–99)
Phosphorus: 6.3 mg/dL — ABNORMAL HIGH (ref 2.5–4.6)
Potassium: 5 mmol/L (ref 3.5–5.1)
Sodium: 129 mmol/L — ABNORMAL LOW (ref 135–145)

## 2018-05-16 LAB — PARATHYROID HORMONE, INTACT (NO CA): PTH: 164 pg/mL — ABNORMAL HIGH (ref 15–65)

## 2018-05-16 LAB — CULTURE, BLOOD (ROUTINE X 2)
CULTURE: NO GROWTH
Culture: NO GROWTH
SPECIAL REQUESTS: ADEQUATE
Special Requests: ADEQUATE

## 2018-05-16 LAB — CBC
HCT: 25.3 % — ABNORMAL LOW (ref 36.0–46.0)
Hemoglobin: 8.3 g/dL — ABNORMAL LOW (ref 12.0–15.0)
MCH: 32.9 pg (ref 26.0–34.0)
MCHC: 32.8 g/dL (ref 30.0–36.0)
MCV: 100.4 fL — ABNORMAL HIGH (ref 80.0–100.0)
Platelets: 306 10*3/uL (ref 150–400)
RBC: 2.52 MIL/uL — AB (ref 3.87–5.11)
RDW: 16.5 % — ABNORMAL HIGH (ref 11.5–15.5)
WBC: 23.8 10*3/uL — ABNORMAL HIGH (ref 4.0–10.5)
nRBC: 4.6 % — ABNORMAL HIGH (ref 0.0–0.2)

## 2018-05-16 LAB — PATHOLOGIST SMEAR REVIEW

## 2018-05-16 LAB — HEPARIN LEVEL (UNFRACTIONATED): Heparin Unfractionated: 0.68 IU/mL (ref 0.30–0.70)

## 2018-05-16 MED ORDER — CHLORHEXIDINE GLUCONATE CLOTH 2 % EX PADS
6.0000 | MEDICATED_PAD | Freq: Every day | CUTANEOUS | Status: DC
  Administered 2018-05-17 – 2018-05-18 (×2): 6 via TOPICAL

## 2018-05-16 MED ORDER — DOCUSATE SODIUM 100 MG PO CAPS
100.0000 mg | ORAL_CAPSULE | Freq: Two times a day (BID) | ORAL | Status: DC
  Administered 2018-05-16 (×2): 100 mg via ORAL
  Filled 2018-05-16 (×2): qty 1

## 2018-05-16 MED ORDER — HEPARIN SODIUM (PORCINE) 1000 UNIT/ML IJ SOLN
2.4000 mL | Freq: Once | INTRAMUSCULAR | Status: AC
  Administered 2018-05-16: 2400 [IU] via INTRAVENOUS
  Filled 2018-05-16: qty 3

## 2018-05-16 MED ORDER — CALCITRIOL 0.25 MCG PO CAPS
0.2500 ug | ORAL_CAPSULE | Freq: Every day | ORAL | Status: DC
  Administered 2018-05-16 – 2018-05-22 (×7): 0.25 ug via ORAL
  Filled 2018-05-16 (×7): qty 1

## 2018-05-16 NOTE — Progress Notes (Signed)
Subjective: Interval History: feels a little better  Objective: Vital signs in last 24 hours: Temp:  [97.4 F (36.3 C)-98.3 F (36.8 C)] 97.4 F (36.3 C) (01/16 0730) Pulse Rate:  [37-72] 52 (01/16 0730) Resp:  [15-28] 17 (01/16 0730) BP: (83-120)/(50-97) 96/62 (01/16 0700) SpO2:  [90 %-100 %] 98 % (01/16 0730) Weight:  [63.5 kg] 63.5 kg (01/16 0500) Weight change: 0 kg  Intake/Output from previous day: 01/15 0701 - 01/16 0700 In: 1446.8 [P.O.:960; I.V.:486.8] Out: 1295 [Urine:295] Intake/Output this shift: Total I/O In: 220 [P.O.:220] Out: 8 [Urine:8]  General appearance: alert, cooperative, no distress and pale Neck: IJ cath Resp: rales bibasilar Cardio: S1, S2 normal and systolic murmur: systolic ejection 2/6, crescendo and decrescendo at 2nd left intercostal space GI: pos bs, liver down 5 cm Extremities: edema 3+  Lab Results: Recent Labs    05/15/18 0844 05/16/18 0500  WBC 20.1* 23.8*  HGB 8.2* 8.3*  HCT 25.3* 25.3*  PLT 322 306   BMET:  Recent Labs    05/15/18 0217 05/16/18 0500  NA 125* 129*  K 5.6* 5.0  CL 91* 95*  CO2 16* 20*  GLUCOSE 150* 127*  BUN 112* 77*  CREATININE 7.21* 5.71*  CALCIUM 9.1 8.4*   Recent Labs    05/15/18 0807  PTH 164*   Iron Studies:  Recent Labs    05/15/18 0844  IRON 53  TIBC 293    Studies/Results: Dg Chest Port 1 View  Result Date: 05/14/2018 CLINICAL DATA:  Status post dialysis catheter placement. EXAM: PORTABLE CHEST 1 VIEW COMPARISON:  Radiograph of May 11, 2018. FINDINGS: Stable cardiomegaly. Atherosclerosis of thoracic aorta is noted. Interval placement of right internal jugular dialysis catheter with distal tip in expected position of cavoatrial junction. No pneumothorax or pleural effusion is noted. No acute pulmonary disease is noted. Bony thorax is unremarkable. IMPRESSION: Interval placement of right internal jugular dialysis catheter with tip in expected position of cavoatrial junction. No  pneumothorax is noted. Electronically Signed   By: Marijo Conception, M.D.   On: 05/14/2018 16:22    I have reviewed the patient's current medications.  Assessment/Plan: 1 AKI from contrast/low bps.  Tol HD well. Still high level solute, low SNa but improved and sx better. Will do again today 2 Anemia stable, on ESA 3 CAD 4 DM controlled 5 HPTH vit D P HD, esa, vit D, cont to see if improves    LOS: 14 days   Secilia Apps 05/16/2018,7:58 AM

## 2018-05-16 NOTE — Plan of Care (Signed)
  Problem: Cardiac: Goal: Ability to achieve and maintain adequate cardiopulmonary perfusion will improve Outcome: Progressing   Problem: Clinical Measurements: Goal: Will remain free from infection Outcome: Progressing Goal: Diagnostic test results will improve Outcome: Progressing Goal: Cardiovascular complication will be avoided Outcome: Progressing   Problem: Coping: Goal: Level of anxiety will decrease Outcome: Progressing   Problem: Nutrition: Goal: Adequate nutrition will be maintained Outcome: Not Progressing  Patient continues to have a poor appetite.

## 2018-05-16 NOTE — Progress Notes (Addendum)
NAME:  Brandy Salazar, MRN:  [id number], DOB:  Jan 29, 1946, LOS: 22 ADMISSION DATE:  05/09/2018, CONSULTATION DATE:  1/15 REFERRING MD:  Cardiology, CHIEF COMPLAINT:  Chest pain    Brief History   72 yo F presented on 1/2 with chest pain found to have anterolateral MI. Cardiac cath showed an occluded mid LAD without other significant CAD. EF 40-45%. No intervention due to late presentation, managed medically. Hospital course complicated by cardiogenic shock requiring pressors, atrial fibrillation, and acute on chronic renal failure now on dialysis.  Past Medical History  HTN, CKD  Significant Hospital Events   1/2 Admit  1/6 Cardiac cath  1/15 Started iHD   Consults:  Cardiology, nephrology, palliative care   Procedures:  1/6 Cardiac cath  1/14 Right IJ HD catheter   Significant Diagnostic Tests:  1/6 Cardiac cath: Subacute occlusion of LAD  1/6 Echocardiogram: EF 40-45%, akinesis of mid-apicalanteroseptal, anterior, anterolateral, inferior, inferoseptal, and apical myocardium. Grade 1 diastolic dysfunction. PA pressure 78.   Micro Data:  1/11 Blood cx >> NG x 4 days 1/12 Urine cx >> no growth (final)  Antimicrobials:  None   Interim history/subjective:  Laying in bed awake. No complaints this morning.   Objective   Blood pressure 96/62, pulse (!) 53, temperature 98.1 F (36.7 C), temperature source Oral, resp. rate 16, height 5\' 3"  (1.6 m), weight 63.5 kg, SpO2 98 %.        Intake/Output Summary (Last 24 hours) at 05/16/2018 0750 Last data filed at 05/16/2018 0745 Gross per 24 hour  Intake 1446.83 ml  Output 1303 ml  Net 143.83 ml   Filed Weights   05/15/18 0500 05/15/18 0810 05/16/18 0500  Weight: 63.5 kg 63.5 kg 63.5 kg    Examination: General: Laying in bed, NAD  HENT: NCAT, PERRL Lungs: CTAB, no wheezes, rales, or rhonchi  Cardiovascular: Bradycardic and irregular, 3/6 holosystolic murmur Abdomen: Soft, non distended, non tender Extremities: Warm, no  edema Neuro: Alert & oriented, following commands  Resolved Hospital Problem list     Assessment & Plan:   Acute on Chronic Renal Insufficiency: Remains oliguric, UOP ~ 300 / 24 hours. Right IJ dialysis catheter placed 1/14 and iHD started yesterday. Not a candidate for long term dialysis per notes.  -- Will need further goals of care and transition to comfort if no renal recovery on short term iHD -- Dialysis again today per nephro -- Appreciate palliative care consult; will re consult pending nephrology plans for continued HD or not -- Continue levophed goal MAP > 65  Subacute Anterolateral STEMI Ischemic cardiomyopathy  Paroxsymal atrial fibrillation  -- Continue dual antiplatelet ASA & plavix  -- No ACEi / ARB with renal failure. Unable to tolerate beta blocker with bradycardia  -- Previously on amiodarone for paroxsymal atrial fibrillation, dc'd 1/15 due to sinus bradycardia, remains in sinus this morning  -- IV heparin, will need transition to oral anticoagulant. Likely warfarin given renal failure   Constipation -- Add scheduled colace BID  Best practice:  Diet: Renal with fluid restriction  Pain/Anxiety/Delirium protocol (if indicated): tramadol prn  VAP protocol (if indicated): n/a DVT prophylaxis: on full dose heparin  GI prophylaxis: N/A Glucose control: monitor  Mobility: PT / OT Code Status: DNR Family Communication: None at bedside Disposition: Remain in ICU   Velna Ochs, M.D. - PGY3 Pager: [phone] 05/16/2018, 7:50 AM   Attending Note:  73 year old female with STEMI that presented late and developed cardiogenic shock and renal failure.  Patient is now on dialysis and continues to require pressors.  On exam, lungs are clear and abdomen is benign.  I reviewed CXR myself, mild pulmonary edema noted.  Discussed with PCCM-NP.  Will continue pressor support for now.  HD again today for the last time since no UOP.  Will consult with palliative in AM.  Continue  DNR status.  Replace electrolytes.  WBC elevated with no fever and no nidus of infection.  Will monitor for signs of sepsis.  PCCM will continue to follow.  The patient is critically ill with multiple organ systems failure and requires high complexity decision making for assessment and support, frequent evaluation and titration of therapies, application of advanced monitoring technologies and extensive interpretation of multiple databases.   Critical Care Time devoted to patient care services described in this note is  33  Minutes. This time reflects time of care of this signee Dr Jennet Maduro. This critical care time does not reflect procedure time, or teaching time or supervisory time of PA/NP/Med student/Med Resident etc but could involve care discussion time.  Rush Farmer, M.D. [facility] Pulmonary/Critical Care Medicine. Pager: [phone]. After hours pager: [phone].

## 2018-05-16 NOTE — Progress Notes (Signed)
[place] for heparin Indication: atrial fibrillation  No Known Allergies  Patient Measurements: Height: 5\' 3"  (160 cm) Weight: 139 lb 15.9 oz (63.5 kg) IBW/kg (Calculated) : 52.4 Heparin Dosing Weight: 60kg  Vital Signs: Temp: 97.4 F (36.3 C) (01/16 0730) Temp Source: Oral (01/16 0730) BP: 96/62 (01/16 0700) Pulse Rate: 52 (01/16 0730)  Labs: Recent Labs    05/14/18 0312  05/14/18 1330 05/15/18 0217 05/15/18 0844 05/16/18 0500  HGB  --    < > 8.3*  --  8.2* 8.3*  HCT  --   --  25.7*  --  25.3* 25.3*  PLT  --   --  377  --  322 306  HEPARINUNFRC 0.55  --   --  0.45  --  0.68  CREATININE 6.44*  --  6.71* 7.21*  --  5.71*   < > = values in this interval not displayed.    Estimated Creatinine Clearance: 8 mL/min (A) (by C-G formula based on SCr of 5.71 mg/dL (H)).   Medical History: Past Medical History:  Diagnosis Date  . Hypertension   . Renal disorder    Assessment: 73 year old female presented to [facility] with weakness, ST elevations noted on EKG. Patient is s/p cath on 05/22/2018. Pharmacy has been consulted to restart heparin gtt for afib.  Patient's heparin level has increased post iHD but remains therapeutic this AM at 0.68 on heparin drip rate 800 units/hr. Patient has no overt bleeding or complications with infusion per nurse.  Long term AC still in discussion.  Goal of Therapy:  Heparin level 0.3-0.7 units/ml Monitor platelets by anticoagulation protocol: Yes   Plan:  Continue IV heparin at 800 units/hr Monitor anti-Xa level daily while on heparin Continue to monitor H&H, platelets, and for s/sx of bleeding F/u plans for oral anticoagulation  [place] Student 05/16/2018 8:20 AM

## 2018-05-16 NOTE — Progress Notes (Signed)
Late Entry  Discussed with Dr. Jimmy Footman the rationale behind keeping foley catheter in place during AM rounds. He stated they just started HD on patient yesterday and patient has had issues with urinary retention in the past. He would like to keep foley in place for another 24-48 hours. Will continue to monitor urine output.  Joellen Jersey, RN

## 2018-05-16 NOTE — Progress Notes (Signed)
   05/16/18 1422  OT Visit Information  Last OT Received On 05/16/18  Reason Eval/Treat Not Completed Patient at procedure or test/ unavailable (HD)  Nestor Lewandowsky, OTR/L Acute Rehabilitation Services Pager: [phone] Office: [phone]

## 2018-05-16 NOTE — Progress Notes (Addendum)
Physical Therapy Treatment Patient Details Name: Brandy Salazar MRN: [id number] DOB: 03/17/1946 Today's Date: 05/16/2018    History of Present Illness Pt adm with STEMI complicated by acute kidney injury. HD initiated 05/15/18. PMH - HTN, CKD    PT Comments    Pt extremely fatigued with little activity. Almost collapsing onto chair or bed after 75' of ambulation. Concerned with pt's poor activity tolerance at this point. Currently plan is home with family but if pt doesn't begin improving may need to look at SNF option.    Follow Up Recommendations  Home health PT;Supervision/Assistance - 24 hour(may need SNF if doesn't progress)     Equipment Recommendations  Other (comment)(rollator)    Recommendations for Other Services       Precautions / Restrictions Precautions Precautions: Fall Restrictions Weight Bearing Restrictions: No    Mobility  Bed Mobility Overal bed mobility: Needs Assistance Bed Mobility: Supine to Sit     Supine to sit: Mod assist     General bed mobility comments: Assist to scoot to the EOB  Transfers Overall transfer level: Needs assistance Equipment used: 4-wheeled walker Transfers: Sit to/from [facility] Sit to Stand: Min assist Stand pivot transfers: Mod assist       General transfer comment: Assist to bring hips up and for balance. After amb pt exhausted and required mod assist to stand pivot from bed to recliner  Ambulation/Gait Ambulation/Gait assistance: Min assist;Mod assist Gait Distance (Feet): 70 Feet(x 2) Assistive device: 4-wheeled walker Gait Pattern/deviations: Step-through pattern;Decreased step length - left;Trunk flexed;Drifts right/left;Narrow base of support;Decreased stride length;Decreased step length - right Gait velocity: decr Gait velocity interpretation: <1.31 ft/sec, indicative of household ambulator General Gait Details: Assist for balance and support. At end of amb pt required mod assist due to  fatigue and to be able to reach a seated surface safely   Stairs             Wheelchair Mobility    Modified Rankin (Stroke Patients Only)       Balance Overall balance assessment: Needs assistance Sitting-balance support: No upper extremity supported;Feet supported Sitting balance-Leahy Scale: Fair     Standing balance support: Single extremity supported;During functional activity Standing balance-Leahy Scale: Poor Standing balance comment: rollator and min assist for static standing                            Cognition Arousal/Alertness: Awake/alert Behavior During Therapy: Flat affect Overall Cognitive Status: Within Functional Limits for tasks assessed                                        Exercises      General Comments        Pertinent Vitals/Pain Pain Assessment: No/denies pain    Home Living                      Prior Function            PT Goals (current goals can now be found in the care plan section) Progress towards PT goals: Not progressing toward goals - comment(fatigue)    Frequency    Min 3X/week      PT Plan Current plan remains appropriate;Discharge plan needs to be updated    Co-evaluation  AM-PAC PT "6 Clicks" Mobility   Outcome Measure  Help needed turning from your back to your side while in a flat bed without using bedrails?: None Help needed moving from lying on your back to sitting on the side of a flat bed without using bedrails?: A Little Help needed moving to and from a bed to a chair (including a wheelchair)?: A Lot Help needed standing up from a chair using your arms (e.g., wheelchair or bedside chair)?: A Little Help needed to walk in hospital room?: A Little Help needed climbing 3-5 steps with a railing? : A Lot 6 Click Score: 17    End of Session   Activity Tolerance: Patient limited by fatigue Patient left: in chair;with call bell/phone within  reach;with family/visitor present Nurse Communication: Mobility status PT Visit Diagnosis: Unsteadiness on feet (R26.81);Muscle weakness (generalized) (M62.81)     Time: 0355-9741 PT Time Calculation (min) (ACUTE ONLY): 27 min  Charges:  $Gait Training: 8-22 mins                     [place] Pager [phone] Office [place] 05/16/2018, 2:24 PM

## 2018-05-16 NOTE — Progress Notes (Signed)
  No charge note.  PMT will continue to shadow the chart for an opportunity to re-engage if needed.   Please contact our office if needed sooner.  Florentina Jenny, PA-C Palliative Medicine Office [place]

## 2018-05-17 LAB — HEPARIN LEVEL (UNFRACTIONATED): HEPARIN UNFRACTIONATED: 0.51 [IU]/mL (ref 0.30–0.70)

## 2018-05-17 LAB — CBC
HCT: 25.4 % — ABNORMAL LOW (ref 36.0–46.0)
Hemoglobin: 8.1 g/dL — ABNORMAL LOW (ref 12.0–15.0)
MCH: 32.3 pg (ref 26.0–34.0)
MCHC: 31.9 g/dL (ref 30.0–36.0)
MCV: 101.2 fL — ABNORMAL HIGH (ref 80.0–100.0)
Platelets: 244 10*3/uL (ref 150–400)
RBC: 2.51 MIL/uL — ABNORMAL LOW (ref 3.87–5.11)
RDW: 17 % — ABNORMAL HIGH (ref 11.5–15.5)
WBC: 21.4 10*3/uL — ABNORMAL HIGH (ref 4.0–10.5)
nRBC: 6.8 % — ABNORMAL HIGH (ref 0.0–0.2)

## 2018-05-17 LAB — RENAL FUNCTION PANEL
ALBUMIN: 2.4 g/dL — AB (ref 3.5–5.0)
Anion gap: 12 (ref 5–15)
BUN: 56 mg/dL — ABNORMAL HIGH (ref 8–23)
CO2: 22 mmol/L (ref 22–32)
Calcium: 8.5 mg/dL — ABNORMAL LOW (ref 8.9–10.3)
Chloride: 96 mmol/L — ABNORMAL LOW (ref 98–111)
Creatinine, Ser: 5 mg/dL — ABNORMAL HIGH (ref 0.44–1.00)
GFR calc Af Amer: 9 mL/min — ABNORMAL LOW (ref >60)
GFR calc non Af Amer: 8 mL/min — ABNORMAL LOW (ref >60)
Glucose, Bld: 122 mg/dL — ABNORMAL HIGH (ref 70–99)
POTASSIUM: 4.4 mmol/L (ref 3.5–5.1)
Phosphorus: 4.8 mg/dL — ABNORMAL HIGH (ref 2.5–4.6)
Sodium: 130 mmol/L — ABNORMAL LOW (ref 135–145)

## 2018-05-17 MED ORDER — SENNOSIDES-DOCUSATE SODIUM 8.6-50 MG PO TABS
1.0000 | ORAL_TABLET | Freq: Two times a day (BID) | ORAL | Status: DC
  Administered 2018-05-17 – 2018-05-22 (×7): 1 via ORAL
  Filled 2018-05-17 (×9): qty 1

## 2018-05-17 MED ORDER — MIDODRINE HCL 5 MG PO TABS
5.0000 mg | ORAL_TABLET | Freq: Three times a day (TID) | ORAL | Status: DC
  Administered 2018-05-17 – 2018-05-21 (×13): 5 mg via ORAL
  Filled 2018-05-17 (×13): qty 1

## 2018-05-17 NOTE — Progress Notes (Signed)
Subjective: Interval History: has no complaint , eating some, not all.  Objective: Vital signs in last 24 hours: Temp:  [97.4 F (36.3 C)-98.4 F (36.9 C)] 97.6 F (36.4 C) (01/17 0700) Pulse Rate:  [51-120] 69 (01/17 0700) Resp:  [15-28] 20 (01/17 0700) BP: (86-111)/(47-84) 86/71 (01/17 0700) SpO2:  [84 %-100 %] 98 % (01/17 0700) Weight:  [63.3 kg] 63.3 kg (01/17 0500) Weight change: -0.2 kg  Intake/Output from previous day: 01/16 0701 - 01/17 0700 In: 968.9 [P.O.:520; I.V.:448.9] Out: 1108 [Urine:108] Intake/Output this shift: No intake/output data recorded.  General appearance: alert, cooperative and no distress Neck: IJ cath Resp: clear to auscultation bilaterally Cardio: S1, S2 normal and systolic murmur: holosystolic 3/6, blowing at apex GI: soft, pos bs, liver down 5 cm Extremities: edema 2-3+  Lab Results: Recent Labs    05/16/18 0500 05/17/18 0426  WBC 23.8* 21.4*  HGB 8.3* 8.1*  HCT 25.3* 25.4*  PLT 306 244   BMET:  Recent Labs    05/16/18 0500 05/17/18 0426  NA 129* 130*  K 5.0 4.4  CL 95* 96*  CO2 20* 22  GLUCOSE 127* 122*  BUN 77* 56*  CREATININE 5.71* 5.00*  CALCIUM 8.4* 8.5*   Recent Labs    05/15/18 0807  PTH 164*   Iron Studies:  Recent Labs    05/15/18 0844  IRON 53  TIBC 293    Studies/Results: No results found.  I have reviewed the patient's current medications.  Assessment/Plan: 1 AKI  ATN from dye and low bp.   Vol xs mod, tol Hd.  bp marginal.  Less uremic, acid/base/k/SNa better. Some urine 2 CKD3-4 from DM 3 Anemia esa/Fe 4 DM controlled 5 HPTH vit D 6 CAD   7 Low EF P follow urine, HD tomorrow if no better.   LOS: 15 days   Jeneen Rinks Shayden Gingrich 05/17/2018,8:13 AM

## 2018-05-17 NOTE — Progress Notes (Signed)
Physical Therapy Treatment Patient Details Name: Brandy Salazar MRN: [id number] DOB: January 02, 1946 Today's Date: 05/17/2018    History of Present Illness Pt adm with STEMI complicated by acute kidney injury. HD initiated 05/15/18. PMH - HTN, CKD    PT Comments    Patient progressing slowly towards her goals. Ambulating 65 feet x 2 with walker and close chair follow utilized. Fatigues very quickly and sometimes begins to sit without warning. Continues with decreased endurance and weakness. Will need to confirm with family they can provide 24/7 assist, otherwise may need SNF.    Follow Up Recommendations  Home health PT;Supervision/Assistance - 24 hour((will need SNF if family unable to provide 24/7 assist))     Equipment Recommendations  Other (comment)(rollator)    Recommendations for Other Services       Precautions / Restrictions Precautions Precautions: Fall Restrictions Weight Bearing Restrictions: No    Mobility  Bed Mobility Overal bed mobility: Modified Independent Bed Mobility: Sit to Supine           General bed mobility comments: no physical assistance needed  Transfers Overall transfer level: Needs assistance   Transfers: Sit to/from Stand Sit to Stand: Min guard            Ambulation/Gait Ambulation/Gait assistance: Min guard Gait Distance (Feet): 65 Feet(x2) Assistive device: Rolling walker (2 wheeled) Gait Pattern/deviations: Trunk flexed;Drifts right/left;Narrow base of support;Decreased stride length;Step-through pattern Gait velocity: decr   General Gait Details: Cues for walker proximity and posture. Pt tending to attempt to sit without warning on one instance. Close chair follow utilized   Marine scientist Rankin (Stroke Patients Only)       Balance Overall balance assessment: Needs assistance   Sitting balance-Leahy Scale: Good     Standing balance support: Bilateral upper extremity  supported Standing balance-Leahy Scale: Poor                              Cognition Arousal/Alertness: Awake/alert Behavior During Therapy: Flat affect Overall Cognitive Status: Within Functional Limits for tasks assessed                                        Exercises      General Comments        Pertinent Vitals/Pain Pain Assessment: Faces Faces Pain Scale: No hurt    Home Living                      Prior Function            PT Goals (current goals can now be found in the care plan section) Acute Rehab PT Goals Patient Stated Goal: return home Potential to Achieve Goals: Good Progress towards PT goals: Progressing toward goals    Frequency    Min 3X/week      PT Plan Current plan remains appropriate;Discharge plan needs to be updated    Co-evaluation              AM-PAC PT "6 Clicks" Mobility   Outcome Measure  Help needed turning from your back to your side while in a flat bed without using bedrails?: None Help needed moving from lying on your back to sitting on the side of a flat bed  without using bedrails?: None Help needed moving to and from a bed to a chair (including a wheelchair)?: A Little Help needed standing up from a chair using your arms (e.g., wheelchair or bedside chair)?: A Little Help needed to walk in hospital room?: A Little Help needed climbing 3-5 steps with a railing? : A Lot 6 Click Score: 19    End of Session Equipment Utilized During Treatment: Gait belt Activity Tolerance: Patient tolerated treatment well Patient left: in chair;with call bell/phone within reach;with family/visitor present Nurse Communication: Mobility status PT Visit Diagnosis: Unsteadiness on feet (R26.81);Muscle weakness (generalized) (M62.81)     Time: 4975-3005 PT Time Calculation (min) (ACUTE ONLY): 23 min  Charges:  $Gait Training: 8-22 mins $Therapeutic Activity: 8-22 mins                      Ellamae Sia, PT, DPT Acute Rehabilitation Services Pager [phone] Office [phone]    Willy Eddy 05/17/2018, 5:00 PM

## 2018-05-17 NOTE — Progress Notes (Addendum)
NAME:  Brandy Salazar, MRN:  [id number], DOB:  1945/11/19, LOS: 3 ADMISSION DATE:  05/27/2018, CONSULTATION DATE:  1/15 REFERRING MD:  Cardiology, CHIEF COMPLAINT:  Chest pain    Brief History   [name] presented on 1/2 with chest pain found to have anterolateral MI. Cardiac cath showed an occluded mid LAD without other significant CAD. EF 40-45%. No intervention due to late presentation, managed medically. Hospital course complicated by cardiogenic shock requiring pressors, atrial fibrillation, and acute on chronic renal failure now on dialysis.  Past Medical History  HTN, CKD  Significant Hospital Events   1/2 Admit  1/6 Cardiac cath  1/15 Started iHD   Consults:  Cardiology, nephrology, palliative care   Procedures:  1/6 Cardiac cath  1/14 Right IJ HD catheter   Significant Diagnostic Tests:  1/6 Cardiac cath: Subacute occlusion of LAD  1/6 Echocardiogram: EF 40-45%, akinesis of mid-apicalanteroseptal, anterior, anterolateral, inferior, inferoseptal, and apical myocardium. Grade 1 diastolic dysfunction. PA pressure 78.   Micro Data:  1/11 Blood cx >> NG x 4 days 1/12 Urine cx >> no growth (final)  Antimicrobials:  None   Interim history/subjective:  Out of bed this morning sitting up in chair. Off levophed. Asking about discharge from hospital.   Objective   Blood pressure (!) 86/71, pulse 69, temperature 97.9 F (36.6 C), temperature source Oral, resp. rate 20, height 5\' 3"  (1.6 m), weight 63.3 kg, SpO2 98 %.        Intake/Output Summary (Last 24 hours) at 05/17/2018 0713 Last data filed at 05/17/2018 0700 Gross per 24 hour  Intake 968.92 ml  Output 1108 ml  Net -139.08 ml   Filed Weights   05/15/18 0810 05/16/18 0500 05/17/18 0500  Weight: 63.5 kg 63.5 kg 63.3 kg    Examination: General: Sitting up in chair, NAD HENT: NCAT, PERRL Lungs: CTAB, no wheezes, rales, or rhonchi  Cardiovascular: Irregular, 3/6 holosystolic murmur Abdomen: Soft, non  distended, non tender Extremities: Warm, no edema Neuro: Alert & oriented, following commands  Resolved Hospital Problem list     Assessment & Plan:   Acute on Chronic Renal Insufficiency: Remains oliguric, s/p iHD x 2. Unclear if patient is now a candidate for long term dialysis. Will clarify with nephrology. If not, will need transition to comfort care.  -- Dialysis per nephro -- Appreciate palliative care consult; will re consult pending plans for continued HD or not -- Continue levophed goal MAP > 65  Subacute Anterolateral STEMI Ischemic cardiomyopathy  Paroxsymal atrial fibrillation  -- Frequent PVCs on tele this morning, will obtain 12 lead  -- Continue dual antiplatelet ASA & plavix  -- No ACEi / ARB with renal failure. Unable to tolerate beta blocker with bradycardia  -- Previously on amiodarone for paroxsymal atrial fibrillation, dc'd 1/15 due to sinus bradycardia, remains in sinus this morning  -- IV heparin, will need transition to oral anticoagulant. Likely warfarin given renal failure   Constipation -- No BM yesterday with scheduled colace, will change to BID Senokot-S  Best practice:  Diet: Renal with fluid restriction  Pain/Anxiety/Delirium protocol (if indicated): tramadol prn  VAP protocol (if indicated): n/a DVT prophylaxis: on full dose heparin  GI prophylaxis: N/A Glucose control: monitor  Mobility: PT / OT Code Status: DNR Family Communication: None at bedside Disposition: Transfer to tele / [facility]  Velna Ochs, M.D. - PGY3 Pager: [phone] 05/17/2018, 7:13 AM   Attending Note:  73 year old female with STEMI with delayed presentation that  presents to PCCM with cardiogenic shock and renal failure.  Had 2 treatment of dialysis and continues to aneuric.  Plan on HD in AM.  No pressors needed since last night.  On exam, she is alert and interactive active, moving all ext to command.  I reviewed CXR myself, mild pulmonary edema noted.  Discussed with IM  resident.  Cardiogenic shock:  - Add low dose midodrine  STEMI:  - Tele monitoring  AKI:  - HD in AM  - BMET in AM  - Replace electrolytes as indicated  GOC: DNR, if continues to be hypotensive will need to consider palliative approach  Transfer to SDU and to [facility] with PCCM off 1/18  Patient seen and examined, agree with above note.  I dictated the care and orders written for this patient under my direction.  Rush Farmer, [place]

## 2018-05-17 NOTE — Plan of Care (Signed)
  Problem: Activity: Goal: Ability to tolerate increased activity will improve Outcome: Progressing   Problem: Cardiac: Goal: Ability to achieve and maintain adequate cardiopulmonary perfusion will improve Outcome: Progressing   Problem: Clinical Measurements: Goal: Ability to maintain clinical measurements within normal limits will improve Outcome: Progressing Goal: Will remain free from infection Outcome: Progressing Goal: Diagnostic test results will improve Outcome: Progressing Goal: Cardiovascular complication will be avoided Outcome: Progressing   Problem: Activity: Goal: Risk for activity intolerance will decrease Outcome: Progressing   Problem: Nutrition: Goal: Adequate nutrition will be maintained Outcome: Progressing   Problem: Pain Managment: Goal: General experience of comfort will improve Outcome: Progressing   Problem: Safety: Goal: Ability to remain free from injury will improve Outcome: Progressing   Problem: Skin Integrity: Goal: Risk for impaired skin integrity will decrease Outcome: Progressing

## 2018-05-17 NOTE — Progress Notes (Signed)
Discussed with Dr. Jimmy Footman the rationale with keeping foley catheter in place. He stated he would like to maintain catheter for now due to patient's initiation of HD and beginning to produce little amounts of urine. He would like to be able to get a hourly documentation of urine output which is otherwise unable to be obtained. Will continue to reassess daily.  Joellen Jersey, RN

## 2018-05-17 NOTE — Progress Notes (Signed)
[place] for heparin Indication: atrial fibrillation  No Known Allergies  Patient Measurements: Height: 5\' 3"  (160 cm) Weight: 139 lb 8.8 oz (63.3 kg) IBW/kg (Calculated) : 52.4 Heparin Dosing Weight: 60kg  Vital Signs: Temp: 97.9 F (36.6 C) (01/17 0400) Temp Source: Oral (01/17 0400) BP: 86/71 (01/17 0700) Pulse Rate: 69 (01/17 0700)  Labs: Recent Labs    05/15/18 0217 05/15/18 0844 05/16/18 0500 05/17/18 0426  HGB  --  8.2* 8.3* 8.1*  HCT  --  25.3* 25.3* 25.4*  PLT  --  322 306 244  HEPARINUNFRC 0.45  --  0.68 0.51  CREATININE 7.21*  --  5.71* 5.00*    Estimated Creatinine Clearance: 9.1 mL/min (A) (by C-G formula based on SCr of 5 mg/dL (H)).   Medical History: Past Medical History:  Diagnosis Date  . Hypertension   . Renal disorder    Assessment: 73 year old female presented to [facility] with weakness, ST elevations noted on EKG. Patient is s/p cath on 05/21/2018. Pharmacy has been consulted to restart heparin gtt for afib.  Patient's heparin level remains therapeutic this AM at 0.51 on heparin drip rate 800 units/hr. Patient has no overt bleeding or complications with infusion per nurse.  Long term AC still in discussion.  Goal of Therapy:  Heparin level 0.3-0.7 units/ml Monitor platelets by anticoagulation protocol: Yes   Plan:  Continue IV heparin at 800 units/hr Monitor anti-Xa level daily while on heparin Continue to monitor H&H, platelets, and for s/sx of bleeding F/u plans for oral anticoagulation  [place] Student 05/17/2018 7:45 AM

## 2018-05-17 NOTE — Progress Notes (Signed)
Occupational Therapy Treatment Patient Details Name: DESHONNA TRNKA MRN: [id number] DOB: 06-13-1945 Today's Date: 05/17/2018    History of present illness Pt adm with STEMI complicated by acute kidney injury. HD initiated 05/15/18. PMH - HTN, CKD   OT comments  Pt with increase in weakness and report of abdominal pain. Assisted with toileting, seated grooming and return to bed. Pt unable to tolerate further activity. Will continue to follow.  Follow Up Recommendations  Home health OT;Supervision/Assistance - 24 hour(Will need SNF if family not available)    Equipment Recommendations  3 in 1 bedside commode    Recommendations for Other Services      Precautions / Restrictions Precautions Precautions: Fall       Mobility Bed Mobility Overal bed mobility: Needs Assistance Bed Mobility: Sit to Supine       Sit to supine: Mod assist   General bed mobility comments: assist for LEs back into bed  Transfers Overall transfer level: Needs assistance Equipment used: 1 person hand held assist Transfers: Sit to/from Stand;Stand Pivot Transfers Sit to Stand: Min assist Stand pivot transfers: Min assist       General transfer comment: assist to rise and steady, increased time    Balance Overall balance assessment: Needs assistance   Sitting balance-Leahy Scale: Fair     Standing balance support: Single extremity supported Standing balance-Leahy Scale: Poor                             ADL either performed or assessed with clinical judgement   ADL Overall ADL's : Needs assistance/impaired     Grooming: Wash/dry hands;Wash/dry face;Sitting;Set up                   Toilet Transfer: Minimal assistance;Stand-pivot;BSC                   Vision       Perception     Praxis      Cognition Arousal/Alertness: Awake/alert Behavior During Therapy: Flat affect Overall Cognitive Status: Within Functional Limits for tasks assessed                                           Exercises     Shoulder Instructions       General Comments      Pertinent Vitals/ Pain       Pain Assessment: Faces Faces Pain Scale: Hurts even more Pain Location: abdomen Pain Descriptors / Indicators: Grimacing Pain Intervention(s): Monitored during session;Repositioned  Home Living                                          Prior Functioning/Environment              Frequency  Min 2X/week        Progress Toward Goals  OT Goals(current goals can now be found in the care plan section)  Progress towards OT goals: Not progressing toward goals - comment(abdominal pain, fatigue)  Acute Rehab OT Goals Patient Stated Goal: return home OT Goal Formulation: With patient Time For Goal Achievement: 13-Jun-2018 Potential to Achieve Goals: Good  Plan Discharge plan remains appropriate    Co-evaluation  AM-PAC OT "6 Clicks" Daily Activity     Outcome Measure   Help from another person eating meals?: None Help from another person taking care of personal grooming?: A Little Help from another person toileting, which includes using toliet, bedpan, or urinal?: A Little Help from another person bathing (including washing, rinsing, drying)?: A Little Help from another person to put on and taking off regular upper body clothing?: A Little Help from another person to put on and taking off regular lower body clothing?: A Little 6 Click Score: 19    End of Session Equipment Utilized During Treatment: Gait belt      Activity Tolerance Patient limited by fatigue   Patient Left in bed;with call bell/phone within reach;with family/visitor present   Nurse Communication          Time: 4076-8088 OT Time Calculation (min): 16 min  Charges: OT General Charges $OT Visit: 1 Visit OT Treatments $Self Care/Home Management : 8-22 mins  Nestor Lewandowsky, OTR/L Acute Rehabilitation  Services Pager: [phone] Office: [phone]   Malka So 05/17/2018, 1:41 PM

## 2018-05-18 LAB — RENAL FUNCTION PANEL
Albumin: 2.5 g/dL — ABNORMAL LOW (ref 3.5–5.0)
Anion gap: 16 — ABNORMAL HIGH (ref 5–15)
BUN: 68 mg/dL — ABNORMAL HIGH (ref 8–23)
CALCIUM: 8.9 mg/dL (ref 8.9–10.3)
CHLORIDE: 92 mmol/L — AB (ref 98–111)
CO2: 20 mmol/L — ABNORMAL LOW (ref 22–32)
CREATININE: 6.05 mg/dL — AB (ref 0.44–1.00)
GFR calc Af Amer: 7 mL/min — ABNORMAL LOW (ref >60)
GFR calc non Af Amer: 6 mL/min — ABNORMAL LOW (ref >60)
Glucose, Bld: 182 mg/dL — ABNORMAL HIGH (ref 70–99)
Phosphorus: 6.9 mg/dL — ABNORMAL HIGH (ref 2.5–4.6)
Potassium: 5 mmol/L (ref 3.5–5.1)
Sodium: 128 mmol/L — ABNORMAL LOW (ref 135–145)

## 2018-05-18 LAB — CBC
HEMATOCRIT: 25.7 % — AB (ref 36.0–46.0)
Hemoglobin: 8.1 g/dL — ABNORMAL LOW (ref 12.0–15.0)
MCH: 31.8 pg (ref 26.0–34.0)
MCHC: 31.5 g/dL (ref 30.0–36.0)
MCV: 100.8 fL — AB (ref 80.0–100.0)
PLATELETS: 188 10*3/uL (ref 150–400)
RBC: 2.55 MIL/uL — ABNORMAL LOW (ref 3.87–5.11)
RDW: 17.2 % — ABNORMAL HIGH (ref 11.5–15.5)
WBC: 21.9 10*3/uL — ABNORMAL HIGH (ref 4.0–10.5)
nRBC: 8.7 % — ABNORMAL HIGH (ref 0.0–0.2)

## 2018-05-18 LAB — HEPARIN LEVEL (UNFRACTIONATED): HEPARIN UNFRACTIONATED: 0.56 [IU]/mL (ref 0.30–0.70)

## 2018-05-18 MED ORDER — SODIUM CHLORIDE 0.9 % IV SOLN: 100.0000 mL | INTRAVENOUS | Status: DC | PRN

## 2018-05-18 MED ORDER — HEPARIN SODIUM (PORCINE) 1000 UNIT/ML DIALYSIS: 1000.0000 [IU] | INTRAMUSCULAR | Status: DC | PRN

## 2018-05-18 MED ORDER — CHLORHEXIDINE GLUCONATE CLOTH 2 % EX PADS: 6.0000 | MEDICATED_PAD | Freq: Every day | CUTANEOUS | Status: DC

## 2018-05-18 MED ORDER — ALTEPLASE 2 MG IJ SOLR: 2.0000 mg | Freq: Once | INTRAMUSCULAR | Status: DC | PRN

## 2018-05-18 MED ORDER — PENTAFLUOROPROP-TETRAFLUOROETH EX AERO: 1.0000 "application " | INHALATION_SPRAY | CUTANEOUS | Status: DC | PRN

## 2018-05-18 MED ORDER — LIDOCAINE-PRILOCAINE 2.5-2.5 % EX CREA: 1.0000 "application " | TOPICAL_CREAM | CUTANEOUS | Status: DC | PRN

## 2018-05-18 MED ORDER — HEPARIN SODIUM (PORCINE) 1000 UNIT/ML DIALYSIS: 100.0000 [IU]/kg | INTRAMUSCULAR | Status: DC | PRN

## 2018-05-18 MED ORDER — LIDOCAINE HCL (PF) 1 % IJ SOLN: 5.0000 mL | INTRAMUSCULAR | Status: DC | PRN

## 2018-05-18 NOTE — Plan of Care (Signed)
  Problem: Spiritual Needs Goal: Ability to function at adequate level Outcome: Progressing   Problem: Education: Goal: Understanding of cardiac disease, CV risk reduction, and recovery process will improve Outcome: Progressing   Problem: Activity: Goal: Ability to tolerate increased activity will improve Outcome: Progressing

## 2018-05-18 NOTE — Procedures (Signed)
I was present at this session.  I have reviewed the session itself and made appropriate changes.  bp low . HD voia tmp cath , low flows and time intenttionally  Brandy Salazar 1/18/20204:08 PM

## 2018-05-18 NOTE — Progress Notes (Signed)
Brandy Salazar - Stepdown/ICU TEAM  Brandy Salazar  INO:[id number] DOB: [birth year] DOA: 05/31/2018 PCP: Brandy Salazar, L.Brandy Sa, MD    Brief Narrative:  [name] who presented on Salazar/2 with chest pain and was found to have an anterolateral MI.Cardiac cath showed an occluded mid LAD without other significant CAD.EF 40-45%. No intervention due to late presentation, managed medically. Hospital course complicated by cardiogenic shock requiring pressors, atrial fibrillation, and acute on chronic renal failure now on dialysis.  Significant Events: Salazar/2 Admit  Salazar/6 Cardiac cath > subacute occlusion of LAD Salazar/6 TTE EF 40-45% Salazar/14 R IJ HD cath inserted  Salazar/15 Started iHD Salazar/18 TRH assumed care   Subjective: Resting comfortably in bedside chair. Denies n/v, sob, cp. Feels weak in general.   Assessment & Plan:  Acute Renal Failure on CKD 3  Due to hypotension and IV contrasts - HD per Nephrology - for HD today   Subacute Anterolateral STEMI  late presentation - cath showed an occluded mid LAD without signif other coronary artery disease, there were no collaterals except for faint left to left collaterals - EF in the 40-45% range - intervention was deferred because of late presentation - on dual antiplatelet therapy.  Ischemic cardiomyopathy  EF 40-45% with wall motion normality in the distribution of the LAD  Paroxsymal atrial fibrillation  continue dual antiplatelet ASA &plavix - unable to tolerate beta blocker with bradycardia - amiodarone dc'd due to sinus bradycardia - IV heparin will need transition to oral anticoagulant before d/c home   DVT prophylaxis: IV heparin  Code Status: FULL CODE Family Communication: no family present at time of exam  Disposition Plan: SDU  Consultants:  Cardiology PCCM Nephrology  Palliative Care   Antimicrobials:  none   Objective: Blood pressure 102/69, pulse (!) 51, temperature 97.6 F (36.4 C), temperature source Oral, resp. rate (!) 22, height 5\' 3"   (Salazar.6 m), weight 62.9 kg, SpO2 100 %.  Intake/Output Summary (Last 24 hours) at Salazar/18/2020 1147 Last data filed at Salazar/18/2020 0647 Gross per 24 hour  Intake 833.99 ml  Output 130 ml  Net 703.99 ml   Filed Weights   05/16/18 0500 05/17/18 0500 05/18/18 0512  Weight: 63.5 kg 63.3 kg 62.9 kg    Examination: General: No acute respiratory distress Lungs: Clear to auscultation bilaterally without wheezes or crackles Cardiovascular: Regular rate and rhythm without murmur gallop or rub normal S1 and S2 Abdomen: Nontender, nondistended, soft, bowel sounds positive, no rebound, no ascites, no appreciable mass Extremities: No significant cyanosis, clubbing, or edema bilateral lower extremities  CBC: Recent Labs  Lab 05/16/18 0500 05/17/18 0426 05/18/18 0453  WBC 23.8* 21.4* 21.9*  HGB 8.3* 8.Salazar* 8.Salazar*  HCT 25.3* 25.4* 25.7*  MCV 100.4* 101.2* 100.8*  PLT 306 244 096   Basic Metabolic Panel: Recent Labs  Lab 05/12/18 0233  05/16/18 0500 05/17/18 0426 05/18/18 0453  NA 128*   < > 129* 130* 128*  K 5.Salazar   < > 5.0 4.4 5.0  CL 94*   < > 95* 96* 92*  CO2 18*   < > 20* 22 20*  GLUCOSE 109*   < > 127* 122* 182*  BUN 89*   < > 77* 56* 68*  CREATININE 5.37*   < > 5.71* 5.00* 6.05*  CALCIUM 8.7*   < > 8.4* 8.5* 8.9  MG 2.Salazar  --   --   --   --   PHOS  --    < > 6.3*  4.8* 6.9*   < > = values in this interval not displayed.   GFR: Estimated Creatinine Clearance: 7.5 mL/min (A) (by C-G formula based on SCr of 6.05 mg/dL (H)).  Liver Function Tests: Recent Labs  Lab 05/15/18 0217 05/16/18 0500 05/17/18 0426 05/18/18 0453  ALBUMIN 2.6* 2.4* 2.4* 2.5*    Recent Results (from the past 240 hour(s))  Culture, blood (Routine X 2) w Reflex to ID Panel     Status: None   Collection Time: 05/11/18 11:50 AM  Result Value Ref Range Status   Specimen Description BLOOD LEFT ANTECUBITAL  Final   Special Requests   Final    BOTTLES DRAWN AEROBIC ONLY Blood Culture adequate volume   Culture    Final    NO GROWTH 5 DAYS Performed at [facility], [place] [address] [place], [place] [address]    Report Status 05/16/2018 FINAL  Final  Culture, blood (Routine X 2) w Reflex to ID Panel     Status: None   Collection Time: 05/11/18 11:55 AM  Result Value Ref Range Status   Specimen Description BLOOD LEFT HAND  Final   Special Requests   Final    BOTTLES DRAWN AEROBIC ONLY Blood Culture adequate volume   Culture   Final    NO GROWTH 5 DAYS Performed at [facility], [place] [address] [place], [place] [address]    Report Status 05/16/2018 FINAL  Final  Culture, Urine     Status: None   Collection Time: 05/12/18  7:58 PM  Result Value Ref Range Status   Specimen Description URINE, CATHETERIZED  Final   Special Requests NONE  Final   Culture   Final    NO GROWTH Performed at [facility], [place] [address], [place], [place] [address]    Report Status 05/14/2018 FINAL  Final     Scheduled Meds: . aspirin  81 mg Oral Daily  . atorvastatin  40 mg Oral q1800  . calcitRIOL  0.25 mcg Oral Daily  . Chlorhexidine Gluconate Cloth  6 each Topical Q0600  . Chlorhexidine Gluconate Cloth  6 each Topical Q0600  . clopidogrel  75 mg Oral Q breakfast  . darbepoetin (ARANESP) injection - DIALYSIS  200 mcg Intravenous Q Wed-HD  . fentaNYL (SUBLIMAZE) injection  100 mcg Intravenous Once  . heparin  40 Units/kg Dialysis Once in dialysis  . midodrine  5 mg Oral TID WC  . senna-docusate  Salazar tablet Oral BID  . sodium chloride flush  3 mL Intravenous Q12H   Continuous Infusions: . sodium chloride 10 mL/hr at 05/17/18 1400  . sodium chloride    . sodium chloride    . sodium chloride    . heparin 800 Units/hr (05/17/18 1400)     LOS: 16 days   Cherene Altes, MD [facility] Office  [phone] Pager - Text Page per Amion  If 7PM-7AM, please contact night-coverage per Amion Salazar/18/2020, 11:47 AM

## 2018-05-18 NOTE — Progress Notes (Signed)
Subjective: Interval History: has no complaint, thinks is doing ok.  Objective: Vital signs in last 24 hours: Temp:  [97.4 F (36.3 C)-97.6 F (36.4 C)] 97.6 F (36.4 C) (01/18 0827) Pulse Rate:  [49-114] 51 (01/18 0827) Resp:  [15-24] 22 (01/18 0011) BP: (94-123)/(64-86) 102/69 (01/18 0827) SpO2:  [98 %-100 %] 100 % (01/18 0827) Weight:  [62.9 kg] 62.9 kg (01/18 0512) Weight change: -0.431 kg  Intake/Output from previous day: 01/17 0701 - 01/18 0700 In: 948 [P.O.:520; I.V.:428] Out: 160 [Urine:160] Intake/Output this shift: No intake/output data recorded.  General appearance: alert, no distress and slowed mentation Neck: IJ cath Resp: rales bibasilar Cardio: S1, S2 normal and systolic murmur: holosystolic 3/6, blowing at apex GI: soft, liver down 4 cm Extremities: edema 2+  Lab Results: Recent Labs    05/17/18 0426 05/18/18 0453  WBC 21.4* 21.9*  HGB 8.1* 8.1*  HCT 25.4* 25.7*  PLT 244 188   BMET:  Recent Labs    05/17/18 0426 05/18/18 0453  NA 130* 128*  K 4.4 5.0  CL 96* 92*  CO2 22 20*  GLUCOSE 122* 182*  BUN 56* 68*  CREATININE 5.00* 6.05*  CALCIUM 8.5* 8.9   No results for input(s): PTH in the last 72 hours. Iron Studies:  Recent Labs    05/15/18 0844  IRON 53  TIBC 293    Studies/Results: No results found.  I have reviewed the patient's current medications.  Assessment/Plan: 1 CKD3-4/AKI some urine but not much, will do HD today 2 Anemia esa/Fe 3 DM controlled 4 HPTH vit D 5 CAD per cards P HD, esa, Fe, vit D, mobilize    LOS: 16 days   Jeneen Rinks Malya Cirillo 05/18/2018,8:33 AM

## 2018-05-18 NOTE — Progress Notes (Signed)
[place] for heparin Indication: atrial fibrillation  No Known Allergies  Patient Measurements: Height: 5\' 3"  (160 cm) Weight: 138 lb 9.6 oz (62.9 kg) IBW/kg (Calculated) : 52.4 Heparin Dosing Weight: 60kg  Vital Signs: Temp: 97.6 F (36.4 C) (01/18 0827) Temp Source: Oral (01/18 0827) BP: 102/69 (01/18 0827) Pulse Rate: 51 (01/18 0827)  Labs: Recent Labs    05/16/18 0500 05/17/18 0426 05/18/18 0453  HGB 8.3* 8.1* 8.1*  HCT 25.3* 25.4* 25.7*  PLT 306 244 188  HEPARINUNFRC 0.68 0.51 0.56  CREATININE 5.71* 5.00* 6.05*    Estimated Creatinine Clearance: 7.5 mL/min (A) (by C-G formula based on SCr of 6.05 mg/dL (H)).   Medical History: Past Medical History:  Diagnosis Date  . Hypertension   . Renal disorder    Assessment: 73 year old female presented to [facility] with weakness, ST elevations noted on EKG. Patient is s/p cath on 05/12/2018. Pharmacy has been consulted to restart heparin gtt for afib.  Patient's heparin level remains therapeutic this AM at 0.56 on heparin drip rate 800 units/hr. Patient has no overt bleeding or complications with infusion per nurse. Long term AC still in discussion. Hgb stable, pltc down - will need to trend closely.  Goal of Therapy:  Heparin level 0.3-0.7 units/ml Monitor platelets by anticoagulation protocol: Yes   Plan:  -Continue IV heparin at 800 units/hr -Monitor anti-Xa level daily while on heparin -Continue to monitor H&H, platelets, and for s/sx of bleeding -F/u plans for oral anticoagulation  Arrie Senate, PharmD, BCPS Clinical Pharmacist [phone] Please check AMION for all [place] numbers 05/18/2018

## 2018-05-18 NOTE — Progress Notes (Signed)
CARDIAC REHAB PHASE I   PRE:  Rate/Rhythm: 56 SB    BP: sitting 102/69    SaO2: 100 RA  MODE:  Ambulation: 50 ft (25 ft x2)  POST:  Rate/Rhythm: 71 SR with PAC/PVCs    BP: sitting 97/76 after third attempt     SaO2: wouldn't register   Pt c/o belly pain, eager to have BM therefore eager to ambulate. Moved to EOB and stood independently. Used RW and followed with rollator. Assist x1 with gait belt and another assist for follow. Pt Needed reminders to step closer to RW at times. C/o some lightheadedness, blinking eyes. Sat and rested after 25 ft in hall. Able to stand and walk another 25 ft and sat and rested again. Then asked to roll back to room to have BM. Pt successful and sat on [facility] for a long time. Daughter with pt. Encouraged recliner.  [place], ACSM 05/18/2018 9:16 AM

## 2018-05-19 LAB — CBC
HCT: 25.8 % — ABNORMAL LOW (ref 36.0–46.0)
Hemoglobin: 8.3 g/dL — ABNORMAL LOW (ref 12.0–15.0)
MCH: 33.2 pg (ref 26.0–34.0)
MCHC: 32.2 g/dL (ref 30.0–36.0)
MCV: 103.2 fL — ABNORMAL HIGH (ref 80.0–100.0)
Platelets: 154 10*3/uL (ref 150–400)
RBC: 2.5 MIL/uL — ABNORMAL LOW (ref 3.87–5.11)
RDW: 18.3 % — ABNORMAL HIGH (ref 11.5–15.5)
WBC: 21.7 10*3/uL — AB (ref 4.0–10.5)
nRBC: 13.3 % — ABNORMAL HIGH (ref 0.0–0.2)

## 2018-05-19 LAB — HEPARIN LEVEL (UNFRACTIONATED): Heparin Unfractionated: 0.58 IU/mL (ref 0.30–0.70)

## 2018-05-19 LAB — RENAL FUNCTION PANEL
Albumin: 2.3 g/dL — ABNORMAL LOW (ref 3.5–5.0)
Anion gap: 15 (ref 5–15)
BUN: 44 mg/dL — ABNORMAL HIGH (ref 8–23)
CO2: 21 mmol/L — ABNORMAL LOW (ref 22–32)
Calcium: 8.6 mg/dL — ABNORMAL LOW (ref 8.9–10.3)
Chloride: 96 mmol/L — ABNORMAL LOW (ref 98–111)
Creatinine, Ser: 4.81 mg/dL — ABNORMAL HIGH (ref 0.44–1.00)
GFR calc Af Amer: 10 mL/min — ABNORMAL LOW (ref >60)
GFR, EST NON AFRICAN AMERICAN: 8 mL/min — AB (ref >60)
Glucose, Bld: 144 mg/dL — ABNORMAL HIGH (ref 70–99)
Phosphorus: 5.4 mg/dL — ABNORMAL HIGH (ref 2.5–4.6)
Potassium: 5 mmol/L (ref 3.5–5.1)
Sodium: 132 mmol/L — ABNORMAL LOW (ref 135–145)

## 2018-05-19 NOTE — Progress Notes (Signed)
ANTICOAGULATION CONSULT NOTE   Pharmacy Consult for heparin Indication: atrial fibrillation  No Known Allergies  Patient Measurements: Height: 5\' 3"  (160 cm) Weight: 143 lb 11.2 oz (65.2 kg) IBW/kg (Calculated) : 52.4 Heparin Dosing Weight: 60kg  Vital Signs: Temp: 97.9 F (36.6 C) (01/19 0858) Temp Source: Oral (01/19 0858) BP: 92/61 (01/19 0858) Pulse Rate: 71 (01/19 0535)  Labs: Recent Labs    05/17/18 0426 05/18/18 0453 05/19/18 0543  HGB 8.1* 8.1* 8.3*  HCT 25.4* 25.7* 25.8*  PLT 244 188 154  HEPARINUNFRC 0.51 0.56 0.58  CREATININE 5.00* 6.05* 4.81*    Estimated Creatinine Clearance: 9.6 mL/min (A) (by C-G formula based on SCr of 4.81 mg/dL (H)).   Medical History: Past Medical History:  Diagnosis Date  . Hypertension   . Renal disorder    Assessment: 73 year old female presented to [facility] with weakness, ST elevations noted on EKG. Patient is s/p cath on 05/30/2018. Pharmacy has been consulted to restart heparin gtt for afib.  Patient's heparin level remains therapeutic this AM at 0.58 on 800 units/hr. Patient has no overt bleeding or complications with infusion per nurse. Long term AC still in discussion. Hgb stable, pltc down - will need to trend closely.  Goal of Therapy:  Heparin level 0.3-0.7 units/ml Monitor platelets by anticoagulation protocol: Yes   Plan:  -Continue IV heparin at 800 units/hr -Monitor anti-Xa level daily while on heparin -Continue to monitor H&H, platelets, and for s/sx of bleeding -F/u plans for oral anticoagulation  Arrie Senate, PharmD, BCPS Clinical Pharmacist [phone] Please check AMION for all [place] numbers 05/19/2018

## 2018-05-19 NOTE — Progress Notes (Signed)
[place] TEAM 1 - Stepdown/ICU TEAM  Brandy Salazar  DVV:[id number] DOB: 02-Jun-1945 DOA: 05/25/2018 PCP: Alroy Dust, L.Marlou Sa, MD    Brief Narrative:  73yo F who presented on 1/2 with chest pain and was found to have an anterolateral MI.Cardiac cath showed an occluded mid LAD without other significant CAD.EF 40-45%. No intervention due to late presentation, managed medically. Hospital course complicated by cardiogenic shock requiring pressors, atrial fibrillation, and acute on chronic renal failure now on dialysis.  Significant Events: 1/2 Admit  1/6 Cardiac cath > subacute occlusion of LAD 1/6 TTE EF 40-45% 1/14 R IJ HD cath inserted  1/15 Started iHD 1/18 TRH assumed care   Subjective: Flat affect. Resting comfortably. Denies cp or sob. States she understands the tx plan and has no questions.   Assessment & Plan:  Acute Renal Failure on CKD 3  Due to hypotension and IV contrast - HD per Nephrology   Subacute Anterolateral STEMI  late presentation - cath showed an occluded mid LAD without signif other coronary artery disease, there were no collaterals except for faint left to left collaterals - EF in the 40-45% range - intervention was deferred because of late presentation - on dual antiplatelet therapy - discuss possible addition of Eliquis w/ Cards team   Ischemic cardiomyopathy  EF 40-45% with wall motion abnormality in the distribution of the LAD  Paroxsymal atrial fibrillation  continue dual antiplatelet ASA &plavix - unable to tolerate beta blocker with bradycardia - amiodarone dc'd due to sinus bradycardia - IV heparin will need transition to oral anticoagulant before d/c home   DVT prophylaxis: IV heparin  Code Status: FULL CODE Family Communication: spoke w/ family at bedside  Disposition Plan: SDU  Consultants:  Cardiology PCCM Nephrology  Palliative Care   Antimicrobials:  none   Objective: Blood pressure 107/69, pulse 60, temperature (!) 97.5 F (36.4  C), temperature source Oral, resp. rate (!) 23, height 5\' 3"  (1.6 m), weight 65.2 kg, SpO2 100 %.  Intake/Output Summary (Last 24 hours) at 05/19/2018 1626 Last data filed at 05/19/2018 0328 Gross per 24 hour  Intake 206.4 ml  Output 300 ml  Net -93.6 ml   Filed Weights   05/18/18 1335 05/18/18 1640 05/19/18 0535  Weight: 62.8 kg 63 kg 65.2 kg    Examination: General: No acute respiratory distress Lungs: CTA B - no wheezing  Cardiovascular: RRR Abdomen: NT/ND, soft, bs+  Extremities: trace B LE edema   CBC: Recent Labs  Lab 05/17/18 0426 05/18/18 0453 05/19/18 0543  WBC 21.4* 21.9* 21.7*  HGB 8.1* 8.1* 8.3*  HCT 25.4* 25.7* 25.8*  MCV 101.2* 100.8* 103.2*  PLT 244 188 626   Basic Metabolic Panel: Recent Labs  Lab 05/17/18 0426 05/18/18 0453 05/19/18 0543  NA 130* 128* 132*  K 4.4 5.0 5.0  CL 96* 92* 96*  CO2 22 20* 21*  GLUCOSE 122* 182* 144*  BUN 56* 68* 44*  CREATININE 5.00* 6.05* 4.81*  CALCIUM 8.5* 8.9 8.6*  PHOS 4.8* 6.9* 5.4*   GFR: Estimated Creatinine Clearance: 9.6 mL/min (A) (by C-G formula based on SCr of 4.81 mg/dL (H)).  Liver Function Tests: Recent Labs  Lab 05/16/18 0500 05/17/18 0426 05/18/18 0453 05/19/18 0543  ALBUMIN 2.4* 2.4* 2.5* 2.3*    Recent Results (from the past 240 hour(s))  Culture, blood (Routine X 2) w Reflex to ID Panel     Status: None   Collection Time: 05/11/18 11:50 AM  Result Value Ref Range Status  Specimen Description BLOOD LEFT ANTECUBITAL  Final   Special Requests   Final    BOTTLES DRAWN AEROBIC ONLY Blood Culture adequate volume   Culture   Final    NO GROWTH 5 DAYS Performed at [facility], [address], [place], [place] [address]    Report Status 05/16/2018 FINAL  Final  Culture, blood (Routine X 2) w Reflex to ID Panel     Status: None   Collection Time: 05/11/18 11:55 AM  Result Value Ref Range Status   Specimen Description BLOOD LEFT HAND  Final   Special Requests   Final    BOTTLES  DRAWN AEROBIC ONLY Blood Culture adequate volume   Culture   Final    NO GROWTH 5 DAYS Performed at [facility], [place] [address], [place], [place] [address]    Report Status 05/16/2018 FINAL  Final  Culture, Urine     Status: None   Collection Time: 05/12/18  7:58 PM  Result Value Ref Range Status   Specimen Description URINE, CATHETERIZED  Final   Special Requests NONE  Final   Culture   Final    NO GROWTH Performed at [facility], [place] [address], [place], [place] [address]    Report Status 05/14/2018 FINAL  Final     Scheduled Meds: . aspirin  81 mg Oral Daily  . atorvastatin  40 mg Oral q1800  . calcitRIOL  0.25 mcg Oral Daily  . Chlorhexidine Gluconate Cloth  6 each Topical Q0600  . clopidogrel  75 mg Oral Q breakfast  . darbepoetin (ARANESP) injection - DIALYSIS  200 mcg Intravenous Q Wed-HD  . midodrine  5 mg Oral TID WC  . senna-docusate  1 tablet Oral BID  . sodium chloride flush  3 mL Intravenous Q12H   Continuous Infusions: . sodium chloride 10 mL/hr at 05/17/18 1400  . sodium chloride    . sodium chloride    . heparin 800 Units/hr (05/17/18 1400)     LOS: 17 days   Cherene Altes, MD [facility] Office  [phone] Pager - Text Page per Amion  If 7PM-7AM, please contact night-coverage per Amion 05/19/2018, 4:26 PM

## 2018-05-19 NOTE — Progress Notes (Signed)
Subjective: Interval History: has no complaint , oveall feels better, walking.  Objective: Vital signs in last 24 hours: Temp:  [97.4 F (36.3 C)-98.6 F (37 C)] 97.9 F (36.6 C) (01/19 0858) Pulse Rate:  [56-84] 71 (01/19 0535) Resp:  [13-26] 23 (01/19 0535) BP: (85-111)/(49-90) 92/61 (01/19 0858) SpO2:  [93 %-100 %] 93 % (01/19 0858) Weight:  [62.8 kg-65.2 kg] 65.2 kg (01/19 0535) Weight change: -0.069 kg  Intake/Output from previous day: 01/18 0701 - 01/19 0700 In: 372.3 [I.V.:372.3] Out: 300  Intake/Output this shift: No intake/output data recorded.  General appearance: alert, cooperative and no distress Neck: IJ cath Resp: diminished breath sounds bilaterally Cardio: S1, S2 normal and systolic murmur: holosystolic 3/6, blowing at apex GI: soft, pos bs,liver down 5 cm Extremities: extremities normal, atraumatic, no cyanosis or edema  2+ edema  Lab Results: Recent Labs    05/18/18 0453 05/19/18 0543  WBC 21.9* 21.7*  HGB 8.1* 8.3*  HCT 25.7* 25.8*  PLT 188 154   BMET:  Recent Labs    05/18/18 0453 05/19/18 0543  NA 128* 132*  K 5.0 5.0  CL 92* 96*  CO2 20* 21*  GLUCOSE 182* 144*  BUN 68* 44*  CREATININE 6.05* 4.81*  CALCIUM 8.9 8.6*   No results for input(s): PTH in the last 72 hours. Iron Studies: No results for input(s): IRON, TIBC, TRANSFERRIN, FERRITIN in the last 72 hours.  Studies/Results: No results found.  I have reviewed the patient's current medications.  Assessment/Plan: 1 CKD4/AKI contrast and low bps.  Vol slowly better. tol HD well.  No Urine recorded but is in bag 2 Anemia esa/Fe 3 CAD per cards 4 HPTH vit D 5 Debill P follow chem , urine, TIW HD at this time    LOS: 17 days   Javaun Dimperio 05/19/2018,9:53 AM

## 2018-05-20 DIAGNOSIS — K5900 Constipation, unspecified: Secondary | ICD-10-CM

## 2018-05-20 LAB — HEPARIN LEVEL (UNFRACTIONATED): Heparin Unfractionated: 0.53 IU/mL (ref 0.30–0.70)

## 2018-05-20 LAB — RENAL FUNCTION PANEL
Albumin: 2.4 g/dL — ABNORMAL LOW (ref 3.5–5.0)
Anion gap: 13 (ref 5–15)
BUN: 59 mg/dL — ABNORMAL HIGH (ref 8–23)
CO2: 22 mmol/L (ref 22–32)
Calcium: 8.6 mg/dL — ABNORMAL LOW (ref 8.9–10.3)
Chloride: 95 mmol/L — ABNORMAL LOW (ref 98–111)
Creatinine, Ser: 6.19 mg/dL — ABNORMAL HIGH (ref 0.44–1.00)
GFR calc Af Amer: 7 mL/min — ABNORMAL LOW (ref >60)
GFR calc non Af Amer: 6 mL/min — ABNORMAL LOW (ref >60)
Glucose, Bld: 122 mg/dL — ABNORMAL HIGH (ref 70–99)
Phosphorus: 5.6 mg/dL — ABNORMAL HIGH (ref 2.5–4.6)
Potassium: 5.2 mmol/L — ABNORMAL HIGH (ref 3.5–5.1)
Sodium: 130 mmol/L — ABNORMAL LOW (ref 135–145)

## 2018-05-20 LAB — CBC
HCT: 25.1 % — ABNORMAL LOW (ref 36.0–46.0)
HEMOGLOBIN: 7.9 g/dL — AB (ref 12.0–15.0)
MCH: 32.4 pg (ref 26.0–34.0)
MCHC: 31.5 g/dL (ref 30.0–36.0)
MCV: 102.9 fL — ABNORMAL HIGH (ref 80.0–100.0)
Platelets: 122 10*3/uL — ABNORMAL LOW (ref 150–400)
RBC: 2.44 MIL/uL — ABNORMAL LOW (ref 3.87–5.11)
RDW: 18.5 % — ABNORMAL HIGH (ref 11.5–15.5)
WBC: 17.4 10*3/uL — ABNORMAL HIGH (ref 4.0–10.5)
nRBC: 17.2 % — ABNORMAL HIGH (ref 0.0–0.2)

## 2018-05-20 MED ORDER — BISACODYL 10 MG RE SUPP
10.0000 mg | Freq: Every day | RECTAL | Status: DC | PRN
  Administered 2018-05-20: 10 mg via RECTAL
  Filled 2018-05-20: qty 1

## 2018-05-20 MED ORDER — POLYETHYLENE GLYCOL 3350 17 G PO PACK
17.0000 g | PACK | Freq: Two times a day (BID) | ORAL | Status: DC
  Administered 2018-05-20 – 2018-05-22 (×5): 17 g via ORAL
  Filled 2018-05-20 (×4): qty 1

## 2018-05-20 MED ORDER — HEPARIN SODIUM (PORCINE) 1000 UNIT/ML IJ SOLN
INTRAMUSCULAR | Status: AC
  Administered 2018-05-20: 2400 [IU] via INTRAVENOUS_CENTRAL
  Filled 2018-05-20: qty 3

## 2018-05-20 MED ORDER — SORBITOL 70 % SOLN
60.0000 mL | Freq: Once | Status: AC
  Administered 2018-05-20: 60 mL via ORAL
  Filled 2018-05-20: qty 60

## 2018-05-20 MED ORDER — DOCUSATE SODIUM 283 MG RE ENEM
1.0000 | ENEMA | Freq: Two times a day (BID) | RECTAL | Status: DC | PRN
  Filled 2018-05-20: qty 1

## 2018-05-20 NOTE — Care Management Important Message (Signed)
Important Message  Patient Details  Name: Brandy Salazar MRN: [id number] Date of Birth: March 09, 1946   Medicare Important Message Given:  Yes    Barb Merino Conda Wannamaker 05/20/2018, 2:32 PM

## 2018-05-20 NOTE — Progress Notes (Signed)
Patient ID: Brandy Salazar, female   DOB: [birth year], 74 y.o.   MRN: [id number] [facility] Progress Note   Assessment/ Plan:   1.  Acute kidney injury on chronic kidney disease stage IV: Suspected likely to be from relative hypotension/ATN along with contrast mediated nephropathy.  Urine output charted is in the anuric range without any clear evidence of renal recovery; continue hemodialysis on a TTS schedule at this time with daily assessment for acute needs. 2.  Hyponatremia: Due to free water excretion defect in the setting of acute kidney injury, need to continue fluid restriction and ultrafiltration with hemodialysis. 3.  Coronary artery disease status post subacute anterolateral STEMI: With late presentation and occluded mid LAD with limited intervention. 4.  Paroxysmal atrial fibrillation: Currently rate controlled, on anticoagulant therapy with heparin drip along with aspirin and Plavix. 5.  Anemia: Secondary to acute/critical illness along with chronic kidney disease, monitor with ongoing ESA.  Subjective:   Complains of abdominal pain and a sensation that she needs to have a bowel movement.  Denies any chest pain or shortness of breath.   Objective:   BP 106/66 (BP Location: Right Arm)   Pulse 63   Temp 97.6 F (36.4 C) (Oral)   Resp 19   Ht 5\' 3"  (1.6 m)   Wt 66.2 kg   SpO2 100%   BMI 25.86 kg/m   Intake/Output Summary (Last 24 hours) at 05/20/2018 1106 Last data filed at 05/20/2018 0200 Gross per 24 hour  Intake 991.48 ml  Output 100 ml  Net 891.48 ml   Weight change: 3.425 kg  Physical Exam: Gen: Appears to be uncomfortable resting in bed CVS: Pulse regular rhythm, normal rate, S1 and S2 with 3/6 HSM Resp: Diminished breath sounds over bases-poor inspiratory effort, no distinct rales Abd: Soft, obese, nontender Ext: 1+ lower extremity edema  Labs: BMET Recent Labs  Lab 05/14/18 1330 05/15/18 0217 05/16/18 0500 05/17/18 0426 05/18/18 0453  05/19/18 0543 05/20/18 0627  NA 128* 125* 129* 130* 128* 132* 130*  K 5.2* 5.6* 5.0 4.4 5.0 5.0 5.2*  CL 94* 91* 95* 96* 92* 96* 95*  CO2 17* 16* 20* 22 20* 21* 22  GLUCOSE 167* 150* 127* 122* 182* 144* 122*  BUN 106* 112* 77* 56* 68* 44* 59*  CREATININE 6.71* 7.21* 5.71* 5.00* 6.05* 4.81* 6.19*  CALCIUM 9.1 9.1 8.4* 8.5* 8.9 8.6* 8.6*  PHOS 7.3* 7.5* 6.3* 4.8* 6.9* 5.4* 5.6*   CBC Recent Labs  Lab 05/17/18 0426 05/18/18 0453 05/19/18 0543 05/20/18 0627  WBC 21.4* 21.9* 21.7* 17.4*  HGB 8.1* 8.1* 8.3* 7.9*  HCT 25.4* 25.7* 25.8* 25.1*  MCV 101.2* 100.8* 103.2* 102.9*  PLT 244 188 154 122*   Medications:    . aspirin  81 mg Oral Daily  . atorvastatin  40 mg Oral q1800  . calcitRIOL  0.25 mcg Oral Daily  . Chlorhexidine Gluconate Cloth  6 each Topical Q0600  . clopidogrel  75 mg Oral Q breakfast  . darbepoetin (ARANESP) injection - DIALYSIS  200 mcg Intravenous Q Wed-HD  . midodrine  5 mg Oral TID WC  . polyethylene glycol  17 g Oral BID  . senna-docusate  1 tablet Oral BID  . sodium chloride flush  3 mL Intravenous Q12H   Elmarie Shiley, MD 05/20/2018, 11:06 AM

## 2018-05-20 NOTE — Progress Notes (Signed)
Occupational Therapy Treatment Patient Details Name: Brandy Salazar MRN: [id number] DOB: 1945/10/19 Today's Date: 05/20/2018    History of present illness Pt adm with STEMI complicated by acute kidney injury. HD initiated 05/15/18. PMH - HTN, CKD   OT comments  Pt with increased SOB today and decreased activity tolerance. Pt performing bed mobility with MinA for trunk elevation. Pt sit to stand with ModA able to tolerate x1 minute. Pt performing ADL functional mobility with RW taking a few steps from bed to [facility] to recliner. Pt attempting to have a BM as she states that she feels so uncomfortable being constipated. Pt sitting on BSC leaning on RW x10 mins. Pt transferring to recliner with [place]. Pt tolerating session fair. Pt's O2 levels  >80% on 4L, could not get a true reading throughout tasks. Pt bumped to 4L O2 upon leaving as pt satting at 88% requiring 1 min to regain >90%. Pt has decreased in ability to care for self to mobilize. Pt would benefit from continued OT skilled services for ADL, mobility and safety in SNF setting.    Follow Up Recommendations  SNF;Supervision/Assistance - 24 hour    Equipment Recommendations  3 in 1 bedside commode    Recommendations for Other Services      Precautions / Restrictions Precautions Precautions: Fall Restrictions Weight Bearing Restrictions: No       Mobility Bed Mobility Overal bed mobility: Needs Assistance Bed Mobility: Supine to Sit     Supine to sit: Min guard Sit to supine: Min guard   General bed mobility comments: no physical assistance needed but pt needed incr time  Transfers Overall transfer level: Needs assistance Equipment used: Rolling walker (2 wheeled) Transfers: Sit to/from Stand Sit to Stand: Min guard Stand pivot transfers: Min guard       General transfer comment: assist to rise and steady, increased time    Balance Overall balance assessment: Needs assistance Sitting-balance support: No upper  extremity supported;Feet supported Sitting balance-Leahy Scale: Fair     Standing balance support: Bilateral upper extremity supported Standing balance-Leahy Scale: Poor Standing balance comment: min assist for static standing as well as bil UE support                           ADL either performed or assessed with clinical judgement   ADL Overall ADL's : Needs assistance/impaired Eating/Feeding: Set up;Sitting(cues for pursed lip breathing technique)   Grooming: Wash/dry hands;Wash/dry face;Sitting;Set up;Minimal assistance   Upper Body Bathing: Minimal assistance;Sitting   Lower Body Bathing: Maximal assistance;Sit to/from stand   Upper Body Dressing : Minimal assistance   Lower Body Dressing: Maximal assistance   Toilet Transfer: Minimal assistance;Stand-pivot;BSC           Functional mobility during ADLs: Minimal assistance;Rolling walker General ADL Comments: Pt primarily needing assist due to lines, safety and decreased activity tolerance.     Vision       Perception     Praxis      Cognition Arousal/Alertness: Awake/alert Behavior During Therapy: Flat affect Overall Cognitive Status: Within Functional Limits for tasks assessed                                          Exercises     Shoulder Instructions       General Comments      Pertinent Vitals/  Pain       Pain Assessment: 0-10 Pain Score: 0-No pain  Home Living                                          Prior Functioning/Environment              Frequency  Min 2X/week        Progress Toward Goals  OT Goals(current goals can now be found in the care plan section)  Progress towards OT goals: Progressing toward goals  Acute Rehab OT Goals Patient Stated Goal: have a BM OT Goal Formulation: With patient Time For Goal Achievement: 05/30/2018 Potential to Achieve Goals: Good ADL Goals Pt Will Perform Grooming: with  supervision;standing Pt Will Perform Upper Body Bathing: with set-up;sitting Pt Will Perform Lower Body Bathing: with supervision;sit to/from stand Pt Will Perform Upper Body Dressing: with set-up;sitting Pt Will Perform Lower Body Dressing: with supervision;sit to/from stand Pt Will Transfer to Toilet: with supervision;ambulating;regular height toilet Pt Will Perform Toileting - Clothing Manipulation and hygiene: with supervision;sit to/from stand Additional ADL Goal #1: Pt will utilize energy conservation strategies in ADL and mobility independently.  Plan Discharge plan needs to be updated    Co-evaluation                 AM-PAC OT "6 Clicks" Daily Activity     Outcome Measure   Help from another person eating meals?: None Help from another person taking care of personal grooming?: A Little Help from another person toileting, which includes using toliet, bedpan, or urinal?: A Lot Help from another person bathing (including washing, rinsing, drying)?: A Little Help from another person to put on and taking off regular upper body clothing?: A Little Help from another person to put on and taking off regular lower body clothing?: A Lot 6 Click Score: 17    End of Session Equipment Utilized During Treatment: Gait belt;Rolling walker  OT Visit Diagnosis: Unsteadiness on feet (R26.81);Other abnormalities of gait and mobility (R26.89);Muscle weakness (generalized) (M62.81)   Activity Tolerance Patient limited by fatigue;Patient limited by lethargy;Treatment limited secondary to medical complications (Comment)(pt uncomfortable as she had not had a BM)   Patient Left with call bell/phone within reach;in chair;with chair alarm set   Nurse Communication Mobility status        Time: 9509-3267 OT Time Calculation (min): 30 min  Charges: OT General Charges $OT Visit: 1 Visit OT Treatments $Self Care/Home Management : 23-37 mins  Ebony Hail Harold Hedge) Marsa Aris OTR/L Acute  Rehabilitation Services Pager: [phone] Office: [phone]    Fredda Hammed 05/20/2018, 2:57 PM

## 2018-05-20 NOTE — Progress Notes (Signed)
Came to ambulate however pt c/o too much abdominal pain. Sts she needs more for her bowels, requesting a suppository. Declined ambulation. Discussed with RN. Will attempt to return to ambulate. Has PT today as well.  Whitney CES, ACSM 8:39 AM 05/20/2018

## 2018-05-20 NOTE — Progress Notes (Signed)
Physical Therapy Treatment Patient Details Name: Brandy Salazar MRN: [id number] DOB: 1946/03/29 Today's Date: 05/20/2018    History of Present Illness Pt adm with STEMI complicated by acute kidney injury. HD initiated 05/15/18. PMH - HTN, CKD    PT Comments    Pt admitted with above diagnosis. Pt currently with functional limitations due to the deficits listed below (see PT Problem List). Pt was able to ambulate with RW to door and back.  Pt weak and fatigues quickly. Exhausted at end of walk.  Noted blood in urine and nurse is aware. Will follow acutely.  Pt will benefit from skilled PT to increase their independence and safety with mobility to allow discharge to the venue listed below.     Follow Up Recommendations  Home health PT;Supervision/Assistance - 24 hour((will need SNF if family unable to provide 24/7 assist))     Equipment Recommendations  Other (comment)(rollator)    Recommendations for Other Services       Precautions / Restrictions Precautions Precautions: Fall Restrictions Weight Bearing Restrictions: No    Mobility  Bed Mobility Overal bed mobility: Modified Independent Bed Mobility: Supine to Sit     Supine to sit: Min guard Sit to supine: Min guard   General bed mobility comments: no physical assistance needed but pt needed incr time  Transfers Overall transfer level: Needs assistance Equipment used: Rolling walker (2 wheeled) Transfers: Sit to/from Stand Sit to Stand: Min guard Stand pivot transfers: Min guard       General transfer comment: assist to rise and steady, increased time  Ambulation/Gait Ambulation/Gait assistance: Min guard Gait Distance (Feet): 30 Feet Assistive device: Rolling walker (2 wheeled) Gait Pattern/deviations: Trunk flexed;Drifts right/left;Narrow base of support;Decreased stride length;Step-through pattern Gait velocity: decr Gait velocity interpretation: <1.31 ft/sec, indicative of household ambulator General Gait  Details: Cues for walker proximity and posture. Incr flexed posture as she fatigues.     Stairs             Wheelchair Mobility    Modified Rankin (Stroke Patients Only)       Balance Overall balance assessment: Needs assistance Sitting-balance support: No upper extremity supported;Feet supported Sitting balance-Leahy Scale: Good     Standing balance support: Bilateral upper extremity supported Standing balance-Leahy Scale: Poor Standing balance comment: min assist for static standing as well as bil UE support                            Cognition Arousal/Alertness: Awake/alert Behavior During Therapy: Flat affect Overall Cognitive Status: Within Functional Limits for tasks assessed                                        Exercises      General Comments        Pertinent Vitals/Pain Pain Assessment: No/denies pain    Home Living                      Prior Function            PT Goals (current goals can now be found in the care plan section) Acute Rehab PT Goals Patient Stated Goal: return home Progress towards PT goals: Progressing toward goals    Frequency    Min 3X/week      PT Plan Current plan remains appropriate;Discharge plan needs to be  updated    Co-evaluation              AM-PAC PT "6 Clicks" Mobility   Outcome Measure  Help needed turning from your back to your side while in a flat bed without using bedrails?: None Help needed moving from lying on your back to sitting on the side of a flat bed without using bedrails?: None Help needed moving to and from a bed to a chair (including a wheelchair)?: A Little Help needed standing up from a chair using your arms (e.g., wheelchair or bedside chair)?: A Little Help needed to walk in hospital room?: A Little Help needed climbing 3-5 steps with a railing? : A Lot 6 Click Score: 19    End of Session Equipment Utilized During Treatment: Gait  belt Activity Tolerance: Patient tolerated treatment well Patient left: with call bell/phone within reach;in bed Nurse Communication: Mobility status PT Visit Diagnosis: Unsteadiness on feet (R26.81);Muscle weakness (generalized) (M62.81)     Time: 7096-2836 PT Time Calculation (min) (ACUTE ONLY): 13 min  Charges:  $Gait Training: 8-22 mins                     Mack Pager:  [phone]  Office:  [place] 05/20/2018, 12:58 PM

## 2018-05-20 NOTE — Progress Notes (Signed)
ANTICOAGULATION CONSULT NOTE   Pharmacy Consult for heparin Indication: atrial fibrillation  No Known Allergies  Patient Measurements: Height: 5\' 3"  (160 cm) Weight: 146 lb (66.2 kg) IBW/kg (Calculated) : 52.4 Heparin Dosing Weight: 60kg  Vital Signs: Temp: 97.8 F (36.6 C) (01/20 0448) Temp Source: Oral (01/20 0448) BP: 101/73 (01/20 0448) Pulse Rate: 66 (01/20 0448)  Labs: Recent Labs    05/18/18 0453 05/19/18 0543 05/20/18 0627  HGB 8.1* 8.3* 7.9*  HCT 25.7* 25.8* 25.1*  PLT 188 154 122*  HEPARINUNFRC 0.56 0.58 0.53  CREATININE 6.05* 4.81* 6.19*    Estimated Creatinine Clearance: 7.5 mL/min (A) (by C-G formula based on SCr of 6.19 mg/dL (H)).   Medical History: Past Medical History:  Diagnosis Date  . Hypertension   . Renal disorder    Assessment: 73 year old female presented to [facility] with weakness, ST elevations noted on EKG. Patient is s/p cath on 05/14/2018. Pharmacy has been consulted to restart heparin gtt for afib.  Patient's heparin level remains therapeutic this morning at 0.53, on 800 units/hr. Patient has no overt bleeding or complications with infusion per nurse - does have some blood in urine. Long term AC still in discussion. Hgb down slightly at 7.9, pltc trended down to 122- will need to trend closely.  Goal of Therapy:  Heparin level 0.3-0.7 units/ml Monitor platelets by anticoagulation protocol: Yes   Plan:  -Continue IV heparin at 800 units/hr -Monitor anti-Xa level daily while on heparin -Continue to monitor H&H, platelets, and for s/sx of bleeding -F/u plans for oral anticoagulation  Antonietta Jewel, PharmD, BCCCP Clinical Pharmacist  Pager: [phone] Phone: [phone] Please check AMION for all [place] numbers 05/20/2018

## 2018-05-20 NOTE — Procedures (Signed)
Patient seen on Hemodialysis. BP (P) 125/79 (BP Location: Right Arm)   Pulse (P) 68   Temp (P) 98.9 F (37.2 C) (Oral)   Resp (P) 18   Ht 5\' 3"  (1.6 m)   Wt 66.2 kg   SpO2 (P) 98%   BMI 25.86 kg/m   QB 400, UF goal 2.5L Tolerating treatment without complaints at this time.   Elmarie Shiley MD [facility]. Office # [phone] Pager # [phone] 3:56 PM

## 2018-05-20 NOTE — Progress Notes (Addendum)
[facility] TEAM 1 - Stepdown/ICU TEAM  Brandy Salazar  WUX:[id number] DOB: 29-Aug-1945 DOA: 05/27/2018 PCP: Alroy Dust, L.Marlou Sa, MD    Brief Narrative:  73yo F who presented on 1/2 with chest pain and was found to have an anterolateral MI.Cardiac cath showed an occluded mid LAD without other significant CAD.EF 40-45%. No intervention due to late presentation, managed medically. Hospital course complicated by cardiogenic shock requiring pressors, atrial fibrillation, and acute on chronic renal failure now on dialysis.  Significant Events: 1/2 Admit  1/6 Cardiac cath > subacute occlusion of LAD 1/6 TTE EF 40-45% 1/14 R IJ HD cath inserted  1/15 Started iHD 1/18 TRH assumed care   Subjective: Reports ongoing diffuse abdom cramping and constipation. Denies cp, sob, n/v.   Assessment & Plan:  Acute Renal Failure on CKD 3  Due to hypotension and IV contrast - HD per Nephrology - no evidence of renal recovery   Subacute Anterolateral STEMI  late presentation - cath showed an occluded mid LAD without signif other coronary artery disease, there were no collaterals except for faint left to left collaterals - EF in the 40-45% range - intervention was deferred because of late presentation - on dual antiplatelet therapy - discussed possible addition of Eliquis w/ Cards team, who suggest stopping ASA when Eliquis initiated    Ischemic cardiomyopathy  EF 40-45% with wall motion abnormality in the distribution of the LAD - volume management per HD  Paroxsymal atrial fibrillation  unable to tolerate beta blocker with bradycardia - amiodarone dc'd due to sinus bradycardia - IV heparin to be transitioned to eliquis when constipation cleared / assured no more serious GI issues   Macrocytic anemia Check O53 and folic acid - fe studies c/w ACD and signif Fe deficit - clearly a component of CKD at play   DVT prophylaxis: IV heparin > eliquis  Code Status: DNR - NO CODE Family Communication: spoke w/  family in room  Disposition Plan: SDU  Consultants:  Cardiology PCCM Nephrology  Palliative Care   Antimicrobials:  none   Objective: Blood pressure (!) 107/56, pulse 75, temperature (!) 97.4 F (36.3 C), temperature source Oral, resp. rate 18, height 5\' 3"  (1.6 m), weight 66.2 kg, SpO2 98 %.  Intake/Output Summary (Last 24 hours) at 05/20/2018 1533 Last data filed at 05/20/2018 1128 Gross per 24 hour  Intake 1157.85 ml  Output 100 ml  Net 1057.85 ml   Filed Weights   05/18/18 1640 05/19/18 0535 05/20/18 0448  Weight: 63 kg 65.2 kg 66.2 kg    Examination: General: No acute respiratory distress at rest in bed  Lungs: CTA B  Cardiovascular: RRR Abdomen: tender diffusely, no mass, no rebound, bs+ Extremities: trace B LE edema w/o change   CBC: Recent Labs  Lab 05/18/18 0453 05/19/18 0543 05/20/18 0627  WBC 21.9* 21.7* 17.4*  HGB 8.1* 8.3* 7.9*  HCT 25.7* 25.8* 25.1*  MCV 100.8* 103.2* 102.9*  PLT 188 154 664*   Basic Metabolic Panel: Recent Labs  Lab 05/18/18 0453 05/19/18 0543 05/20/18 0627  NA 128* 132* 130*  K 5.0 5.0 5.2*  CL 92* 96* 95*  CO2 20* 21* 22  GLUCOSE 182* 144* 122*  BUN 68* 44* 59*  CREATININE 6.05* 4.81* 6.19*  CALCIUM 8.9 8.6* 8.6*  PHOS 6.9* 5.4* 5.6*   GFR: Estimated Creatinine Clearance: 7.5 mL/min (A) (by C-G formula based on SCr of 6.19 mg/dL (H)).  Liver Function Tests: Recent Labs  Lab 05/17/18 0426 05/18/18 0453 05/19/18 0543  05/20/18 0627  ALBUMIN 2.4* 2.5* 2.3* 2.4*    Recent Results (from the past 240 hour(s))  Culture, blood (Routine X 2) w Reflex to ID Panel     Status: None   Collection Time: 05/11/18 11:50 AM  Result Value Ref Range Status   Specimen Description BLOOD LEFT ANTECUBITAL  Final   Special Requests   Final    BOTTLES DRAWN AEROBIC ONLY Blood Culture adequate volume   Culture   Final    NO GROWTH 5 DAYS Performed at [facility], [place] [address], [place], [place] [address]    Report  Status 05/16/2018 FINAL  Final  Culture, blood (Routine X 2) w Reflex to ID Panel     Status: None   Collection Time: 05/11/18 11:55 AM  Result Value Ref Range Status   Specimen Description BLOOD LEFT HAND  Final   Special Requests   Final    BOTTLES DRAWN AEROBIC ONLY Blood Culture adequate volume   Culture   Final    NO GROWTH 5 DAYS Performed at [facility], [place] [address], [place], [place] [address]    Report Status 05/16/2018 FINAL  Final  Culture, Urine     Status: None   Collection Time: 05/12/18  7:58 PM  Result Value Ref Range Status   Specimen Description URINE, CATHETERIZED  Final   Special Requests NONE  Final   Culture   Final    NO GROWTH Performed at [facility], [place] [address], [place], [place] [address]    Report Status 05/14/2018 FINAL  Final     Scheduled Meds: . aspirin  81 mg Oral Daily  . atorvastatin  40 mg Oral q1800  . calcitRIOL  0.25 mcg Oral Daily  . Chlorhexidine Gluconate Cloth  6 each Topical Q0600  . clopidogrel  75 mg Oral Q breakfast  . darbepoetin (ARANESP) injection - DIALYSIS  200 mcg Intravenous Q Wed-HD  . midodrine  5 mg Oral TID WC  . polyethylene glycol  17 g Oral BID  . senna-docusate  1 tablet Oral BID  . sodium chloride flush  3 mL Intravenous Q12H   Continuous Infusions: . sodium chloride 10 mL/hr at 05/17/18 1400  . sodium chloride    . sodium chloride    . heparin 800 Units/hr (05/20/18 1044)     LOS: 18 days   Cherene Altes, MD [facility] Office  [phone] Pager - Text Page per Amion  If 7PM-7AM, please contact night-coverage per Amion 05/20/2018, 3:33 PM

## 2018-05-21 LAB — CBC
HCT: 24.4 % — ABNORMAL LOW (ref 36.0–46.0)
Hemoglobin: 7.7 g/dL — ABNORMAL LOW (ref 12.0–15.0)
MCH: 32.6 pg (ref 26.0–34.0)
MCHC: 31.6 g/dL (ref 30.0–36.0)
MCV: 103.4 fL — ABNORMAL HIGH (ref 80.0–100.0)
NRBC: 18.3 % — AB (ref 0.0–0.2)
Platelets: UNDETERMINED 10*3/uL (ref 150–400)
RBC: 2.36 MIL/uL — ABNORMAL LOW (ref 3.87–5.11)
RDW: 18.6 % — ABNORMAL HIGH (ref 11.5–15.5)
WBC: 15.7 10*3/uL — AB (ref 4.0–10.5)

## 2018-05-21 LAB — RENAL FUNCTION PANEL
Albumin: 2.2 g/dL — ABNORMAL LOW (ref 3.5–5.0)
Anion gap: 14 (ref 5–15)
BUN: 36 mg/dL — ABNORMAL HIGH (ref 8–23)
CO2: 23 mmol/L (ref 22–32)
Calcium: 8.1 mg/dL — ABNORMAL LOW (ref 8.9–10.3)
Chloride: 96 mmol/L — ABNORMAL LOW (ref 98–111)
Creatinine, Ser: 3.96 mg/dL — ABNORMAL HIGH (ref 0.44–1.00)
GFR calc non Af Amer: 11 mL/min — ABNORMAL LOW (ref >60)
GFR, EST AFRICAN AMERICAN: 12 mL/min — AB (ref >60)
Glucose, Bld: 156 mg/dL — ABNORMAL HIGH (ref 70–99)
Phosphorus: 5.6 mg/dL — ABNORMAL HIGH (ref 2.5–4.6)
Potassium: 4.4 mmol/L (ref 3.5–5.1)
Sodium: 133 mmol/L — ABNORMAL LOW (ref 135–145)

## 2018-05-21 LAB — VITAMIN B12: Vitamin B-12: 4827 pg/mL — ABNORMAL HIGH (ref 180–914)

## 2018-05-21 LAB — FOLATE: FOLATE: 13 ng/mL (ref >5.9)

## 2018-05-21 LAB — HEPARIN LEVEL (UNFRACTIONATED): Heparin Unfractionated: 0.47 IU/mL (ref 0.30–0.70)

## 2018-05-21 MED ORDER — MIDODRINE HCL 5 MG PO TABS
10.0000 mg | ORAL_TABLET | Freq: Three times a day (TID) | ORAL | Status: DC
  Administered 2018-05-21 – 2018-05-22 (×5): 10 mg via ORAL
  Filled 2018-05-21 (×5): qty 2

## 2018-05-21 MED ORDER — FUROSEMIDE 10 MG/ML IJ SOLN
80.0000 mg | Freq: Once | INTRAMUSCULAR | Status: AC
  Administered 2018-05-21: 80 mg via INTRAVENOUS
  Filled 2018-05-21: qty 8

## 2018-05-21 NOTE — Progress Notes (Signed)
Patient ID: Brandy Salazar, female   DOB: 1945/11/13, 73 y.o.   MRN: [id number] [facility] Progress Note   Assessment/ Plan:   1.  Acute kidney injury on chronic kidney disease stage IV: Suspected likely to be from relative hypotension/ATN along with contrast mediated nephropathy.  With some improvement of urine output noted overnight-barely nonoliguric and labs looking better because of dialysis yesterday.  I will continue to monitor daily for assessment of renal recovery and order dialysis as needed.  If she requires dialysis later this week, will have her temporary dialysis catheter converted to tunneled.  Will challenge with diuretic today. 2.  Hyponatremia: Due to free water excretion defect in the setting of acute kidney injury, will continue fluid restriction and ultrafiltration with hemodialysis. 3.  Coronary artery disease status post subacute anterolateral STEMI: With late presentation and occluded mid LAD with limited intervention. 4.  Paroxysmal atrial fibrillation: Currently rate controlled, on anticoagulant therapy with heparin drip along with aspirin and Plavix. 5.  Anemia: Secondary to acute/critical illness along with chronic kidney disease, monitor with ongoing ESA.  Subjective:   Denies any chest pain or shortness of breath, reports relief of abdominal pain after good bowel movement yesterday.   Objective:   BP 96/67 (BP Location: Left Arm)   Pulse 77   Temp 97.7 F (36.5 C) (Axillary)   Resp (!) 23   Ht 5\' 3"  (1.6 m)   Wt 64.8 kg   SpO2 95%   BMI 25.30 kg/m   Intake/Output Summary (Last 24 hours) at 05/21/2018 0917 Last data filed at 05/21/2018 0751 Gross per 24 hour  Intake 1385.85 ml  Output 2400 ml  Net -1014.15 ml   Weight change: 2.575 kg  Physical Exam: Gen: Appears comfortable resting in bed, watching television CVS: Pulse regular rhythm, normal rate, S1 and S2 with 3/6 HSM Resp: Diminished breath sounds over bases-poor inspiratory effort,  no distinct rales Abd: Soft, obese, nontender Ext: 2-3+ lower extremity edema  Labs: BMET Recent Labs  Lab 05/15/18 0217 05/16/18 0500 05/17/18 0426 05/18/18 0453 05/19/18 0543 05/20/18 0627 05/21/18 0540  NA 125* 129* 130* 128* 132* 130* 133*  K 5.6* 5.0 4.4 5.0 5.0 5.2* 4.4  CL 91* 95* 96* 92* 96* 95* 96*  CO2 16* 20* 22 20* 21* 22 23  GLUCOSE 150* 127* 122* 182* 144* 122* 156*  BUN 112* 77* 56* 68* 44* 59* 36*  CREATININE 7.21* 5.71* 5.00* 6.05* 4.81* 6.19* 3.96*  CALCIUM 9.1 8.4* 8.5* 8.9 8.6* 8.6* 8.1*  PHOS 7.5* 6.3* 4.8* 6.9* 5.4* 5.6* 5.6*   CBC Recent Labs  Lab 05/17/18 0426 05/18/18 0453 05/19/18 0543 05/20/18 0627  WBC 21.4* 21.9* 21.7* 17.4*  HGB 8.1* 8.1* 8.3* 7.9*  HCT 25.4* 25.7* 25.8* 25.1*  MCV 101.2* 100.8* 103.2* 102.9*  PLT 244 188 154 122*   Medications:    . aspirin  81 mg Oral Daily  . atorvastatin  40 mg Oral q1800  . calcitRIOL  0.25 mcg Oral Daily  . Chlorhexidine Gluconate Cloth  6 each Topical Q0600  . clopidogrel  75 mg Oral Q breakfast  . darbepoetin (ARANESP) injection - DIALYSIS  200 mcg Intravenous Q Wed-HD  . midodrine  5 mg Oral TID WC  . polyethylene glycol  17 g Oral BID  . senna-docusate  1 tablet Oral BID  . sodium chloride flush  3 mL Intravenous Q12H   Elmarie Shiley, MD 05/21/2018, 9:17 AM

## 2018-05-21 NOTE — Clinical Social Work Note (Signed)
Clinical Social Work Assessment  Patient Details  Name: Brandy Salazar MRN: [id number] Date of Birth: June 05, 1945  Date of referral:  05/21/18               Reason for consult:  Facility Placement, Discharge Planning                Permission sought to share information with:  Facility Sport and exercise psychologist, Family Supports Permission granted to share information::  Yes, Verbal Permission Granted  Name::     [name]::  SNFs  Relationship::  daughter  Contact Information:  [phone]  Housing/Transportation Living arrangements for the past 2 months:  Single Family Home Source of Information:  Patient Patient Interpreter Needed:  None Criminal Activity/Legal Involvement Pertinent to Current Situation/Hospitalization:  No - Comment as needed Significant Relationships:  Adult Children Lives with:  Self Do you feel safe going back to the place where you live?  Yes Need for family participation in patient care:  No (Coment)  Care giving concerns: Patient from home alone. PT recommending 24 hour care vs SNF. Patient may also be new start to dialysis.   Social Worker assessment / plan: CSW met with patient at bedside. Patient alert and oriented. CSW introduced self and role and discussed disposition planning.   Patient reported that she lives at home alone. Her daughter is visiting from out of state. Patient has other friends and family in the area, but they work and would not be able to help patient at home. Patient is agreeable to rehab at [facility]. Spoke to patient's daughter on the phone. Will meet with patient and daughter later at bedside to review SNF bed offers.  CSW sent out initial SNF referrals, awaiting bed offers. Did note that patient may be new start to dialysis. Will need to follow for nephrology recommendations and HD clip.   Called [employer] and confirmed patient's plan managed by [employer]. Started a SNF authorization request with [employer]. Will need to fax in clinicals. Auth  required before patient can admit to SNF.   CSW to follow and support with discharge planning.  Employment status:  Retired Forensic scientist:  Managed Medicare([employer]) PT Recommendations:  [place] / Referral to community resources:  [place]  Patient/Family's Response to care: Patient appreciative of care.  Patient/Family's Understanding of and Emotional Response to Diagnosis, Current Treatment, and Prognosis: Patient with good understanding of her conditions and agreeable to SNF.  Emotional Assessment Appearance:  Appears stated age Attitude/Demeanor/Rapport:  Engaged Affect (typically observed):  Accepting, Calm, Appropriate Orientation:  Oriented to Self, Oriented to Place, Oriented to  Time, Oriented to Situation Alcohol / Substance use:  Not Applicable Psych involvement (Current and /or in the community):  No (Comment)  Discharge Needs  Concerns to be addressed:  Discharge Planning Concerns, Care Coordination Readmission within the last 30 days:  No Current discharge risk:  Physical Impairment, Lives alone Barriers to Discharge:  Continued Medical Work up, [place], LCSW 05/21/2018, 10:23 AM

## 2018-05-21 NOTE — Progress Notes (Signed)
[place] TEAM 1 - Stepdown/ICU TEAM  Brandy Salazar  HCW:[id number] DOB: 1946/02/13 DOA: 05/04/2018 PCP: Alroy Dust, L.Marlou Sa, MD    Brief Narrative:  73yo F who presented on 1/2 with chest pain and was found to have an anterolateral MI.Cardiac cath showed an occluded mid LAD without other significant CAD.EF 40-45%. No intervention due to late presentation, managed medically. Hospital course complicated by cardiogenic shock requiring pressors, atrial fibrillation, and acute on chronic renal failure now on dialysis.  Significant Events: 1/2 Admit  1/6 Cardiac cath > subacute occlusion of LAD 1/6 TTE EF 40-45% 1/14 R IJ HD cath inserted  1/15 Started iHD 1/18 TRH assumed care   Subjective: Reports that she feels better today in general.  Denies chest pain or shortness of breath.  I spoke with her regarding options for transitioning her to an oral anticoagulant.  She prefers a DOAC to warfarin.  Assessment & Plan:  Acute Renal Failure on CKD 3  Due to hypotension and IV contrast - HD per Nephrology -plan at this time is to continue to monitor for evidence of renal recovery and to order hemodialysis as needed -if it appears she will need ongoing dialysis her dialysis catheter will need to be transitioned to a tunneled catheter -I will hold off on initiating oral anticoagulation until such time that this can be accomplished or it becomes clear that this does not need to be accomplished  Subacute Anterolateral STEMI  late presentation - cath showed an occluded mid LAD without signif other coronary artery disease, there were no collaterals except for faint left to left collaterals - EF in the 40-45% range - intervention was deferred because of late presentation - on dual antiplatelet therapy - discussed possible addition of Eliquis w/ Cards team, who suggest stopping ASA when Eliquis initiated    Ischemic cardiomyopathy  EF 40-45% with wall motion abnormality in the distribution of the LAD -  volume management per HD -patient will require extensive rehab as she has very low exercise tolerance at this time  Paroxsymal atrial fibrillation  unable to tolerate beta blocker with bradycardia - amiodarone dc'd due to sinus bradycardia - IV heparin to be transitioned to eliquis when decision made on whether patient will need a tunneled hemodialysis catheter  Macrocytic anemia V76 and folic acid are not low - Fe studies c/w ACD and signif Fe deficit - clearly a component of CKD at play   DVT prophylaxis: IV heparin > eliquis  Code Status: DNR - NO CODE Family Communication: spoke w/ family in room  Disposition Plan: Stable for transition to telemetry bed status  Consultants:  Cardiology PCCM Nephrology  Palliative Care   Antimicrobials:  none   Objective: Blood pressure 109/68, pulse 83, temperature 97.7 F (36.5 C), temperature source Axillary, resp. rate (!) 25, height 5\' 3"  (1.6 m), weight 64.8 kg, SpO2 99 %.  Intake/Output Summary (Last 24 hours) at 05/21/2018 1523 Last data filed at 05/21/2018 0800 Gross per 24 hour  Intake 1097.48 ml  Output 2400 ml  Net -1302.52 ml   Filed Weights   05/20/18 0448 05/20/18 1250 05/21/18 0638  Weight: 66.2 kg 68.8 kg 64.8 kg    Examination: General: No acute respiratory distress  Lungs: CTA B without wheezing or rhonchi Cardiovascular: RRR without rub with prominent systolic murmur Abdomen: tender diffusely, no mass, no rebound, bs+ Extremities: trace B LE edema   CBC: Recent Labs  Lab 05/19/18 0543 05/20/18 0627 05/21/18 1025  WBC 21.7* 17.4* 15.7*  HGB 8.3* 7.9* 7.7*  HCT 25.8* 25.1* 24.4*  MCV 103.2* 102.9* 103.4*  PLT 154 122* PLATELET CLUMPS NOTED ON SMEAR, UNABLE TO ESTIMATE   Basic Metabolic Panel: Recent Labs  Lab 05/19/18 0543 05/20/18 0627 05/21/18 0540  NA 132* 130* 133*  K 5.0 5.2* 4.4  CL 96* 95* 96*  CO2 21* 22 23  GLUCOSE 144* 122* 156*  BUN 44* 59* 36*  CREATININE 4.81* 6.19* 3.96*  CALCIUM  8.6* 8.6* 8.1*  PHOS 5.4* 5.6* 5.6*   GFR: Estimated Creatinine Clearance: 11.6 mL/min (A) (by C-G formula based on SCr of 3.96 mg/dL (H)).  Liver Function Tests: Recent Labs  Lab 05/18/18 0453 05/19/18 0543 05/20/18 0627 05/21/18 0540  ALBUMIN 2.5* 2.3* 2.4* 2.2*    Recent Results (from the past 240 hour(s))  Culture, Urine     Status: None   Collection Time: 05/12/18  7:58 PM  Result Value Ref Range Status   Specimen Description URINE, CATHETERIZED  Final   Special Requests NONE  Final   Culture   Final    NO GROWTH Performed at [facility], [place] [address] [place], [place] [address]    Report Status 05/14/2018 FINAL  Final     Scheduled Meds: . aspirin  81 mg Oral Daily  . atorvastatin  40 mg Oral q1800  . calcitRIOL  0.25 mcg Oral Daily  . Chlorhexidine Gluconate Cloth  6 each Topical Q0600  . clopidogrel  75 mg Oral Q breakfast  . darbepoetin (ARANESP) injection - DIALYSIS  200 mcg Intravenous Q Wed-HD  . midodrine  10 mg Oral TID WC  . polyethylene glycol  17 g Oral BID  . senna-docusate  1 tablet Oral BID  . sodium chloride flush  3 mL Intravenous Q12H   Continuous Infusions: . sodium chloride 10 mL/hr at 05/17/18 1400  . heparin 800 Units/hr (05/20/18 1044)     LOS: 19 days   Cherene Altes, MD [facility] Office  [phone] Pager - Text Page per Shea Evans  If 7PM-7AM, please contact night-coverage per Amion 05/21/2018, 3:23 PM

## 2018-05-21 NOTE — Progress Notes (Signed)
ANTICOAGULATION CONSULT NOTE   Pharmacy Consult for heparin Indication: atrial fibrillation  No Known Allergies  Patient Measurements: Height: 5\' 3"  (160 cm) Weight: 142 lb 12.8 oz (64.8 kg) IBW/kg (Calculated) : 52.4 Heparin Dosing Weight: 60kg  Vital Signs: Temp: 97.7 F (36.5 C) (01/21 0638) Temp Source: Oral (01/21 1610) BP: 92/68 (01/21 9604) Pulse Rate: 83 (01/21 0638)  Labs: Recent Labs    05/19/18 0543 05/20/18 0627 05/21/18 0540  HGB 8.3* 7.9*  --   HCT 25.8* 25.1*  --   PLT 154 122*  --   HEPARINUNFRC 0.58 0.53 0.47  CREATININE 4.81* 6.19* 3.96*    Estimated Creatinine Clearance: 11.6 mL/min (A) (by C-G formula based on SCr of 3.96 mg/dL (H)).   Medical History: Past Medical History:  Diagnosis Date  . Hypertension   . Renal disorder    Assessment: 73 year old female presented [facility] with weakness, ST elevations noted on EKG. Patient is s/p cath on 05/07/2018. Pharmacy has been consulted to restart heparin gtt for afib.  Patient's heparin level remains therapeutic this morning at 0.47, on 800 units/hr. Patient has no overt bleeding or complications with infusion per nurse - does have some blood in urine. Long term AC still in discussion. Hgb down slightly at 7.9, pltc trended down to 122 on last check yesterday- will need to trend closely, ordered CBC for tomorrow.  Goal of Therapy:  Heparin level 0.3-0.7 units/ml Monitor platelets by anticoagulation protocol: Yes   Plan:  -Continue IV heparin at 800 units/hr -Monitor anti-Xa level daily while on heparin -Continue to monitor H&H, platelets, and for s/sx of bleeding -F/u plans for oral anticoagulation - per notes once constipation resolved and no other GI issues  Antonietta Jewel, PharmD, BCCCP Clinical Pharmacist  Pager: [phone] Phone: [phone] Please check AMION for all [place] numbers 05/21/2018

## 2018-05-21 NOTE — Progress Notes (Signed)
PT Cancellation Note  Patient Details Name: Brandy Salazar MRN: [id number] DOB: 01-07-1946   Cancelled Treatment:    Reason Eval/Treat Not Completed: Other (comment). Checked earlier and pt had just received lunch and then had just amb with cardiac rehab. Pt has continued to struggle with activity tolerance. Do not feel she is going to be able to manage at home and recommend ST-SNF as OT has recommended.    Shary Decamp [facility] 05/21/2018, 2:46 PM [place] Pager [phone] Office [phone]

## 2018-05-21 NOTE — Progress Notes (Signed)
CARDIAC REHAB PHASE I   PRE:  Rate/Rhythm: 80 SR    BP: sitting 93/64    SaO2: 99 RA  MODE:  Ambulation: 44 ft (sat x2)   POST:  Rate/Rhythm: 104 ST with PVCs    BP: sitting 109/68     SaO2: 99 RA   Pt very tired but willing to walk. Used RW assist x2 with gait belt. C/o significant SOB with walking. Sts she is just so tired and SOB. Had to sit and rest after 18 ft in hall, then again after 26 more feet. Asked to be rolled back to room. VSS. Daughter present and assisted with following with recliner. Left pt in recliner. [place], ACSM 05/21/2018 2:08 PM

## 2018-05-21 NOTE — NC FL2 (Signed)
[place] LEVEL OF CARE SCREENING TOOL     IDENTIFICATION  Patient Name: Brandy Salazar Birthdate: Apr 15, 1946 Sex: female Admission Date (Current Location): 05/16/2018  [facility] and [place] Number:  Herbalist and Address:  [facility], [place] [address] [place], [place] [address]      Provider Number: [id number]  Attending Physician Name and Address:  Cherene Altes, MD  Relative Name and Phone Number:  Jerline Pain, [phone]    Current Level of Care: Hospital Recommended Level of Care: [place] Prior Approval Number:    Date Approved/Denied:   PASRR Number: [id number]  Discharge Plan: SNF    Current Diagnoses: Patient Active Problem List   Diagnosis Date Noted  . CHF (congestive heart failure) ([place])   . Palliative care by specialist   . Goals of care, counseling/discussion   . Cardiogenic shock ([place])   . Acute pulmonary edema (HCC)   . Mitral valve insufficiency   . AKI (acute kidney injury) ([place])   . Hyperlipidemia with target LDL less than 70   . Type 2 diabetes mellitus with complication, without long-term current use of insulin ([place])   . Anemia   . MI (myocardial infarction) ([place]) 05/17/2018    Orientation RESPIRATION BLADDER Height & Weight     Self, Time, Situation, Place  O2(Nasal Canula 2L/min) Incontinent, Indwelling catheter Weight: 64.8 kg Height:  5\' 3"  (160 cm)  BEHAVIORAL SYMPTOMS/MOOD NEUROLOGICAL BOWEL NUTRITION STATUS      Continent Diet  AMBULATORY STATUS COMMUNICATION OF NEEDS Skin   Extensive Assist Verbally Normal                       Personal Care Assistance Level of Assistance  Bathing, Feeding, Dressing Bathing Assistance: Limited assistance Feeding assistance: Independent Dressing Assistance: Maximum assistance     Functional Limitations Info  Sight, Hearing, Speech Sight Info: Adequate Hearing Info: Adequate Speech Info: Adequate     SPECIAL CARE FACTORS FREQUENCY  PT (By licensed PT), OT (By licensed OT)     PT Frequency: 5x/weekly OT Frequency: 5x/weekly            Contractures Contractures Info: Not present    Additional Factors Info  Code Status, Allergies Code Status Info: DNR Allergies Info: No known allergies           Current Medications (05/21/2018):  This is the current hospital active medication list Current Facility-Administered Medications  Medication Dose Route Frequency Provider Last Rate Last Dose  . 0.9 %  sodium chloride infusion   Intravenous Continuous Leonie Man, MD 10 mL/hr at 05/17/18 1400    . acetaminophen (TYLENOL) tablet 650 mg  650 mg Oral Q4H PRN Leonie Man, MD      . aspirin chewable tablet 81 mg  81 mg Oral Daily Leonie Man, MD   81 mg at 05/21/18 0844  . atorvastatin (LIPITOR) tablet 40 mg  40 mg Oral q1800 Leonie Man, MD   40 mg at 05/20/18 1803  . bisacodyl (DULCOLAX) suppository 10 mg  10 mg Rectal Daily PRN Cherene Altes, MD   10 mg at 05/20/18 1202  . calcitRIOL (ROCALTROL) capsule 0.25 mcg  0.25 mcg Oral Daily Deterding, Jeneen Rinks, MD   0.25 mcg at 05/21/18 0844  . Chlorhexidine Gluconate Cloth 2 % PADS 6 each  6 each Topical [id number] Mauricia Area, MD   6 each at 05/19/18 0548  .  clopidogrel (PLAVIX) tablet 75 mg  75 mg Oral Q breakfast Leonie Man, MD   75 mg at 05/21/18 0844  . Darbepoetin Alfa (ARANESP) injection 200 mcg  200 mcg Intravenous Q Wed-HD Mauricia Area, MD   200 mcg at 05/15/18 1030  . docusate sodium (ENEMEEZ) enema 283 mg  1 enema Rectal BID PRN Cherene Altes, MD      . furosemide (LASIX) injection 80 mg  80 mg Intravenous Once Elmarie Shiley, MD      . heparin ADULT infusion 100 units/mL (25000 units/257mL sodium chloride 0.45%)  800 Units/hr Intravenous Continuous Durenda Age, MD 8 mL/hr at 05/20/18 1044 800 Units/hr at 05/20/18 1044  . menthol-cetylpyridinium (CEPACOL) lozenge 3 mg  1 lozenge Oral PRN  Leonie Man, MD      . midodrine (PROAMATINE) tablet 10 mg  10 mg Oral TID WC Elmarie Shiley, MD      . ondansetron [facility]) injection 4 mg  4 mg Intravenous Q6H PRN Leonie Man, MD   4 mg at 05/14/18 2224  . polyethylene glycol (MIRALAX / GLYCOLAX) packet 17 g  17 g Oral BID Cherene Altes, MD   17 g at 05/20/18 0948  . senna-docusate (Senokot-S) tablet 1 tablet  1 tablet Oral BID Velna Ochs, MD   1 tablet at 05/18/18 2114  . sodium chloride flush (NS) 0.9 % injection 3 mL  3 mL Intravenous Q12H Leonie Man, MD   3 mL at 05/20/18 0950  . sodium chloride flush (NS) 0.9 % injection 3 mL  3 mL Intravenous PRN Leonie Man, MD   3 mL at 05/12/18 0836  . traMADol (ULTRAM) tablet 50 mg  50 mg Oral Q12H PRN Leonie Man, MD   50 mg at 05/20/18 1202     Discharge Medications: Please see discharge summary for a list of discharge medications.  Relevant Imaging Results:  Relevant Lab Results:   Additional Information SSN- [id number] ; Maybe new hemodyalsis   Estanislado Emms, LCSW

## 2018-05-22 ENCOUNTER — Inpatient Hospital Stay (HOSPITAL_COMMUNITY): Payer: Medicare PPO

## 2018-05-22 DIAGNOSIS — Z992 Dependence on renal dialysis: Secondary | ICD-10-CM

## 2018-05-22 DIAGNOSIS — N186 End stage renal disease: Secondary | ICD-10-CM

## 2018-05-22 DIAGNOSIS — I5023 Acute on chronic systolic (congestive) heart failure: Secondary | ICD-10-CM

## 2018-05-22 LAB — RENAL FUNCTION PANEL
Albumin: 2.2 g/dL — ABNORMAL LOW (ref 3.5–5.0)
Anion gap: 18 — ABNORMAL HIGH (ref 5–15)
BUN: 58 mg/dL — ABNORMAL HIGH (ref 8–23)
CO2: 19 mmol/L — ABNORMAL LOW (ref 22–32)
Calcium: 8.4 mg/dL — ABNORMAL LOW (ref 8.9–10.3)
Chloride: 92 mmol/L — ABNORMAL LOW (ref 98–111)
Creatinine, Ser: 5.51 mg/dL — ABNORMAL HIGH (ref 0.44–1.00)
GFR calc Af Amer: 8 mL/min — ABNORMAL LOW (ref >60)
GFR calc non Af Amer: 7 mL/min — ABNORMAL LOW (ref >60)
Glucose, Bld: 95 mg/dL (ref 70–99)
PHOSPHORUS: 6.3 mg/dL — AB (ref 2.5–4.6)
Potassium: 5.2 mmol/L — ABNORMAL HIGH (ref 3.5–5.1)
Sodium: 129 mmol/L — ABNORMAL LOW (ref 135–145)

## 2018-05-22 LAB — CBC
HEMATOCRIT: 24.1 % — AB (ref 36.0–46.0)
HEMOGLOBIN: 7.8 g/dL — AB (ref 12.0–15.0)
MCH: 33.1 pg (ref 26.0–34.0)
MCHC: 32.4 g/dL (ref 30.0–36.0)
MCV: 102.1 fL — AB (ref 80.0–100.0)
Platelets: 78 10*3/uL — ABNORMAL LOW (ref 150–400)
RBC: 2.36 MIL/uL — ABNORMAL LOW (ref 3.87–5.11)
RDW: 18.6 % — ABNORMAL HIGH (ref 11.5–15.5)
WBC: 17.5 10*3/uL — ABNORMAL HIGH (ref 4.0–10.5)
nRBC: 23.5 % — ABNORMAL HIGH (ref 0.0–0.2)

## 2018-05-22 LAB — HEPARIN LEVEL (UNFRACTIONATED): Heparin Unfractionated: 0.49 IU/mL (ref 0.30–0.70)

## 2018-05-22 LAB — OCCULT BLOOD X 1 CARD TO LAB, STOOL: Fecal Occult Bld: POSITIVE — AB

## 2018-05-22 MED ORDER — TRAMADOL HCL 50 MG PO TABS: 50.0000 mg | ORAL_TABLET | Freq: Two times a day (BID) | ORAL | Status: DC | PRN

## 2018-05-22 MED ORDER — OXYCODONE HCL 5 MG PO TABS
5.0000 mg | ORAL_TABLET | ORAL | Status: DC | PRN
  Administered 2018-05-22 (×2): 5 mg via ORAL
  Filled 2018-05-22 (×2): qty 1

## 2018-05-22 MED ORDER — PENTAFLUOROPROP-TETRAFLUOROETH EX AERO: 1.0000 "application " | INHALATION_SPRAY | CUTANEOUS | Status: DC | PRN

## 2018-05-22 MED ORDER — ALBUMIN HUMAN 25 % IV SOLN
INTRAVENOUS | Status: AC
  Filled 2018-05-22: qty 100

## 2018-05-22 MED ORDER — ANTICOAGULANT SODIUM CITRATE 4% (200MG/5ML) IV SOLN
5.0000 mL | Status: DC | PRN
  Administered 2018-05-22: 5 mL via INTRAVENOUS
  Filled 2018-05-22: qty 5

## 2018-05-22 MED ORDER — LIDOCAINE HCL (PF) 1 % IJ SOLN: 5.0000 mL | INTRAMUSCULAR | Status: DC | PRN

## 2018-05-22 MED ORDER — HEPARIN SODIUM (PORCINE) 1000 UNIT/ML DIALYSIS
40.0000 [IU]/kg | INTRAMUSCULAR | Status: DC | PRN
  Filled 2018-05-22: qty 3

## 2018-05-22 MED ORDER — SODIUM CHLORIDE 0.9% FLUSH: 3.0000 mL | INTRAVENOUS | Status: DC | PRN

## 2018-05-22 MED ORDER — LIDOCAINE-PRILOCAINE 2.5-2.5 % EX CREA: 1.0000 "application " | TOPICAL_CREAM | CUTANEOUS | Status: DC | PRN

## 2018-05-22 MED ORDER — SODIUM CHLORIDE 0.9 % IV BOLUS
250.0000 mL | Freq: Once | INTRAVENOUS | Status: AC
  Administered 2018-05-23: 250 mL via INTRAVENOUS

## 2018-05-22 MED ORDER — HYOSCYAMINE SULFATE 0.125 MG/ML PO SOLN
0.2500 mg | Freq: Once | ORAL | Status: DC
  Filled 2018-05-22: qty 2

## 2018-05-22 MED ORDER — HYOSCYAMINE SULFATE 0.125 MG SL SUBL
0.2500 mg | SUBLINGUAL_TABLET | Freq: Once | SUBLINGUAL | Status: AC
  Administered 2018-05-22: 0.25 mg via SUBLINGUAL
  Filled 2018-05-22: qty 2

## 2018-05-22 MED ORDER — SODIUM CHLORIDE 0.9% FLUSH
3.0000 mL | Freq: Two times a day (BID) | INTRAVENOUS | Status: DC
  Administered 2018-05-22 (×2): 3 mL via INTRAVENOUS

## 2018-05-22 MED ORDER — SODIUM CHLORIDE 0.9 % IV SOLN: 100.0000 mL | INTRAVENOUS | Status: DC | PRN

## 2018-05-22 MED ORDER — ALBUMIN HUMAN 25 % IV SOLN
50.0000 g | Freq: Once | INTRAVENOUS | Status: AC
  Administered 2018-05-22: 50 g via INTRAVENOUS

## 2018-05-22 MED ORDER — ALBUMIN HUMAN 25 % IV SOLN
INTRAVENOUS | Status: AC
  Administered 2018-05-22: 50 g via INTRAVENOUS
  Filled 2018-05-22: qty 100

## 2018-05-22 MED ORDER — SODIUM CHLORIDE 0.9 % IV SOLN: 250.0000 mL | INTRAVENOUS | Status: DC | PRN

## 2018-05-22 MED ORDER — ALTEPLASE 2 MG IJ SOLR: 2.0000 mg | Freq: Once | INTRAMUSCULAR | Status: DC | PRN

## 2018-05-22 MED ORDER — DARBEPOETIN ALFA 200 MCG/0.4ML IJ SOSY
PREFILLED_SYRINGE | INTRAMUSCULAR | Status: AC
  Administered 2018-05-22: 200 ug via INTRAVENOUS
  Filled 2018-05-22: qty 0.4

## 2018-05-22 MED ORDER — HEPARIN SODIUM (PORCINE) 1000 UNIT/ML DIALYSIS
1000.0000 [IU] | INTRAMUSCULAR | Status: DC | PRN
  Filled 2018-05-22: qty 1

## 2018-05-22 MED ORDER — BISACODYL 10 MG RE SUPP
10.0000 mg | Freq: Once | RECTAL | Status: DC
  Filled 2018-05-22: qty 1

## 2018-05-22 NOTE — Progress Notes (Addendum)
Patient ID: Brandy Salazar, female   DOB: 1945/09/10, 73 y.o.   MRN: [id number] [facility] Progress Note   Assessment/ Plan:   1.  Acute kidney injury on chronic kidney disease stage IV: Suspected likely to be from relative hypotension/ATN along with contrast mediated nephropathy.  Unfortunately anuric overnight without any significant response to diuretics.  Will order for hemodialysis today and consult with vascular surgery for permanent access planning as well as conversion of temporary to tunneled dialysis catheter. 2.  Hyponatremia: Due to free water excretion defect in the setting of acute kidney injury, will continue fluid restriction and attempt correction with ultrafiltration/hemodialysis. 3.  Coronary artery disease status post subacute anterolateral STEMI: With late presentation and occluded mid LAD with limited intervention. 4.  Paroxysmal atrial fibrillation: Currently rate controlled, on anticoagulant therapy with heparin drip along with aspirin and Plavix. 5.  Anemia: Secondary to acute/critical illness along with chronic kidney disease, monitor with ongoing ESA.  Subjective:   Reports lower abdominal pain/discomfort and inability to have bowel movement.   Objective:   BP 132/83 (BP Location: Left Arm)   Pulse 72   Temp 97.7 F (36.5 C) (Axillary)   Resp (!) 26   Ht 5\' 3"  (1.6 m)   Wt 67.2 kg   SpO2 100%   BMI 26.24 kg/m   Intake/Output Summary (Last 24 hours) at 05/22/2018 1035 Last data filed at 05/21/2018 2100 Gross per 24 hour  Intake 300 ml  Output -  Net 300 ml   Weight change: -1.6 kg  Physical Exam: Gen: Appears uncomfortable resting in bed, daughter at bedside CVS: Pulse regular rhythm, normal rate, S1 and S2 with 3/6 HSM Resp: Diminished breath sounds over bases-poor inspiratory effort, no distinct rales Abd: Soft, obese, mild tenderness over LLQ and suprapubic area.  Ext: 2-3+ lower extremity edema  Labs: BMET Recent Labs  Lab  05/16/18 0500 05/17/18 0426 05/18/18 0453 05/19/18 0543 05/20/18 0627 05/21/18 0540 05/22/18 0510  NA 129* 130* 128* 132* 130* 133* 129*  K 5.0 4.4 5.0 5.0 5.2* 4.4 5.2*  CL 95* 96* 92* 96* 95* 96* 92*  CO2 20* 22 20* 21* 22 23 19*  GLUCOSE 127* 122* 182* 144* 122* 156* 95  BUN 77* 56* 68* 44* 59* 36* 58*  CREATININE 5.71* 5.00* 6.05* 4.81* 6.19* 3.96* 5.51*  CALCIUM 8.4* 8.5* 8.9 8.6* 8.6* 8.1* 8.4*  PHOS 6.3* 4.8* 6.9* 5.4* 5.6* 5.6* 6.3*   CBC Recent Labs  Lab 05/19/18 0543 05/20/18 0627 05/21/18 1025 05/22/18 0510  WBC 21.7* 17.4* 15.7* 17.5*  HGB 8.3* 7.9* 7.7* 7.8*  HCT 25.8* 25.1* 24.4* 24.1*  MCV 103.2* 102.9* 103.4* 102.1*  PLT 154 122* PLATELET CLUMPS NOTED ON SMEAR, UNABLE TO ESTIMATE 78*   Medications:    . aspirin  81 mg Oral Daily  . atorvastatin  40 mg Oral q1800  . calcitRIOL  0.25 mcg Oral Daily  . Chlorhexidine Gluconate Cloth  6 each Topical Q0600  . clopidogrel  75 mg Oral Q breakfast  . darbepoetin (ARANESP) injection - DIALYSIS  200 mcg Intravenous Q Wed-HD  . midodrine  10 mg Oral TID WC  . polyethylene glycol  17 g Oral BID  . senna-docusate  1 tablet Oral BID  . sodium chloride flush  3 mL Intravenous Q12H   Elmarie Shiley, MD 05/22/2018, 10:35 AM

## 2018-05-22 NOTE — Progress Notes (Signed)
Pharmacy Heparin Induced Thrombocytopenia (HIT) Note:  Brandy Salazar is an 73 y.o. female being evaluated for HIT. Heparin was started 05/07/18 for paroxysmal afib, and baseline platelets were 200-300s.   HIT labs were ordered on 05/22/18 when platelets dropped to 78.  Labs: No results found for: HEPINDPLTAB, SRALOWDOSEHP, SRAHIGHDOSEH    CALCULATE SCORE:  4Ts (see the HIT Algorithm) Score  Thrombocytopenia 2  Timing 1  Thrombosis 0  Other causes of thrombocytopenia 2  Total 5     Recommendations (A or B or C) are based on available lab results:  A. No lab results available (HIT antibody and/or SRA ordered): -Moderate HIT probability: Discontinue heparin/LMWH; HIT antibody recommended; SRA may be considered, consider alternative anticoagulation; document heparin allergy  B. HIT antibody result available: -N/A - HIT antibody not available, and/or SRA not available  C. SRA result available -SRA not available  Name of MD Contacted: Patel  Plan (Discussed with provider) Labs ordered: -Heparin antibody Heparin allergy: -documented or updated Anticoagulation plans -discontinue heparin / LMWH   Comments: Patient currently in NSR. Planning for permanent HD access placement soon. After this procedure, patient will no longer need to be on heparin and can be switched to a PO anticoagulant agent.   Jackson Latino, PharmD PGY1 Pharmacy Resident Phone [phone] 05/22/2018     1:08 PM

## 2018-05-22 NOTE — Progress Notes (Signed)
PT Cancellation Note  Patient Details Name: Brandy Salazar MRN: [id number] DOB: 05/28/1945   Cancelled Treatment:    Reason Eval/Treat Not Completed: Medical issues which prohibited therapy(Pt in too much pain today.  Did get meds this am.)   Denice Paradise 05/22/2018, 10:31 AM [facility] Pager:  [phone]  Office:  [phone]

## 2018-05-22 NOTE — Progress Notes (Signed)
PROGRESS NOTE    Brandy Salazar  OAC:[id number] DOB: 01/03/1946 DOA: 05/07/2018 PCP: Alroy Dust, L.Marlou Sa, MD   Brief Narrative:  73 year old female presented with chest pain and was found to have anterior lateral MI.  Cardiology had initially admitted this patient.  Patient underwent cardiac catheterization showing occluded mid LAD.  EF was 40 to 45%.  No intervention due to late presentation, and manage medically.  Hospital course was complicated by cardiogenic shock requiring pressors, atrial fibrillation, acute on chronic renal failure.  Nephrology currently consulted and following.  Cardiology has since signed off. Assessment & Plan   Acute renal failure on chronic kidney disease, stage III -Creatinine continues to worsen, currently 5.51 -Likely secondary to hypotension with IV contrast -Nephrology consulted and appreciated, patient had right IJ HD cath placed and did require hemodialysis. -Discussed with nephrology, Dr. Posey Pronto, patient will likely need ongoing dialysis as an outpatient and will plan for this by consulting vascular surgery -Continue to monitor BMP  Subacute anterior lateral STEMI -Cardiology had admitted the patient originally.  Patient did have late presentation, catheterization showed an occluded mid LAD without significant other coronary artery disease, there were no collaterals except for faint left to left collaterals.  EF was 40 to 45%. -Intervention was deferred given late presentation.   -Patient currently on dual antiplatelet therapy -Previous hospitalist discussed with cardiology, would consider stopping aspirin when Eliquis is initiated.  Ischemic cardiomyopathy -Appears to be stable, volume management per HD/nephrology -As above, echocardiogram showed an EF of 40 to 45%  Paroxysmal atrial fibrillation -Patient currently in sinus rhythm -Unable to tolerate beta-blocker given bradycardia.  Amiodarone was discontinued due to sinus bradycardia. -Patient was on IV  heparin however this was discontinued today given thrombocytopenia  Macrocytic anemia -W10 and folic acid levels WNL -Iron studies show deficiency consistent with anemia of chronic disease -hemoglobin on admission 11.8, however hemoglobin during hospital course has remained between 8 and 9.  Currently 7.8 -Continue to monitor CBC  Dark stools and abdominal cramping -Per nursing documentation, patient had a large bowel movement overnight which is black tarry -Hemoglobin as above -Discussed with gastroenterology, Dr. Therisa Doyne, patient had a colonoscopy in September 2019 as she had history of polyps.  At that time she had several polyps removed.  Patient's hemoglobin had remained stable and was checked periodically and remained around 11.  Patient had also complained of melanotic stools as well as abdominal pain at that time.  Patient may require EGD as an outpatient -Will continue to monitor -Heparin discontinued, HIT panel ordered  DVT Prophylaxis  SCDs  Code Status: DNR  Family Communication: Daughter at bedside  Disposition Plan: Admitted.  Pending further nephrology recommendations. Dispo TBD  Consultants Cardiology Nephrology PCCM Palliative care Vascular surgery  Procedures/Significant Events 1/2 Admit  1/6 Cardiac cath > subacute occlusion of LAD 1/6 TTE EF 40-45% 1/14 R IJ HD cath inserted  1/15 Started iHD 1/18 TR[facility] assumed care   Antibiotics   Anti-infectives (From admission, onward)   None      Subjective:   Brandy Salazar seen and examined today.  Patient complains of abdominal pain, was able to have a bowel movement overnight.  States that she has had abdominal pain like this in the past.  Denies current chest pain, shortness of breath, nausea or vomiting, dizziness or headache.  Objective:   Vitals:   05/22/18 1230 05/22/18 1300 05/22/18 1330 05/22/18 1400  BP: (!) 95/39 (!) 90/47 (!) 81/46 (!) 79/39  Pulse: (!) 59 77 (!)  49 78  Resp:      Temp:        TempSrc:      SpO2:      Weight:      Height:        Intake/Output Summary (Last 24 hours) at 05/22/2018 1418 Last data filed at 05/21/2018 2100 Gross per 24 hour  Intake 240 ml  Output -  Net 240 ml   Filed Weights   05/21/18 0638 05/22/18 0500 05/22/18 1100  Weight: 64.8 kg 67.2 kg 69 kg    Exam  General: Well developed, chronically ill-appearing, NAD  HEENT: NCAT, mucous membranes moist.   Neck: Supple  Cardiovascular: S1 S2 auscultated, 3/6 SEM, RRR  Respiratory: Diminished but clear breath sounds  Abdomen: Soft, generalized TTP, nondistended, + bowel sounds  Extremities: warm dry without cyanosis clubbing. +LE edema B/L   Neuro: AAOx3, nonfocal  Psych: Normal affect and demeanor with intact judgement and insight   Data Reviewed: I have personally reviewed following labs and imaging studies  CBC: Recent Labs  Lab 05/18/18 0453 05/19/18 0543 05/20/18 0627 05/21/18 1025 05/22/18 0510  WBC 21.9* 21.7* 17.4* 15.7* 17.5*  HGB 8.1* 8.3* 7.9* 7.7* 7.8*  HCT 25.7* 25.8* 25.1* 24.4* 24.1*  MCV 100.8* 103.2* 102.9* 103.4* 102.1*  PLT 188 154 122* PLATELET CLUMPS NOTED ON SMEAR, UNABLE TO ESTIMATE 78*   Basic Metabolic Panel: Recent Labs  Lab 05/18/18 0453 05/19/18 0543 05/20/18 0627 05/21/18 0540 05/22/18 0510  NA 128* 132* 130* 133* 129*  K 5.0 5.0 5.2* 4.4 5.2*  CL 92* 96* 95* 96* 92*  CO2 20* 21* 22 23 19*  GLUCOSE 182* 144* 122* 156* 95  BUN 68* 44* 59* 36* 58*  CREATININE 6.05* 4.81* 6.19* 3.96* 5.51*  CALCIUM 8.9 8.6* 8.6* 8.1* 8.4*  PHOS 6.9* 5.4* 5.6* 5.6* 6.3*   GFR: Estimated Creatinine Clearance: 8.6 mL/min (A) (by C-G formula based on SCr of 5.51 mg/dL (H)). Liver Function Tests: Recent Labs  Lab 05/18/18 0453 05/19/18 0543 05/20/18 0627 05/21/18 0540 05/22/18 0510  ALBUMIN 2.5* 2.3* 2.4* 2.2* 2.2*   No results for input(s): LIPASE, AMYLASE in the last 168 hours. No results for input(s): AMMONIA in the last 168  hours. Coagulation Profile: No results for input(s): INR, PROTIME in the last 168 hours. Cardiac Enzymes: No results for input(s): CKTOTAL, CKMB, CKMBINDEX, TROPONINI in the last 168 hours. BNP (last 3 results) No results for input(s): PROBNP in the last 8760 hours. HbA1C: No results for input(s): HGBA1C in the last 72 hours. CBG: No results for input(s): GLUCAP in the last 168 hours. Lipid Profile: No results for input(s): CHOL, HDL, LDLCALC, TRIG, CHOLHDL, LDLDIRECT in the last 72 hours. Thyroid Function Tests: No results for input(s): TSH, T4TOTAL, FREET4, T3FREE, THYROIDAB in the last 72 hours. Anemia Panel: Recent Labs    05/21/18 0540  VITAMINB12 4,827*  FOLATE 13.0   Urine analysis: No results found for: COLORURINE, APPEARANCEUR, LABSPEC, PHURINE, GLUCOSEU, HGBUR, BILIRUBINUR, KETONESUR, PROTEINUR, UROBILINOGEN, NITRITE, LEUKOCYTESUR Sepsis Labs: @LABRCNTIP (procalcitonin:4,lacticidven:4)  ) Recent Results (from the past 240 hour(s))  Culture, Urine     Status: None   Collection Time: 05/12/18  7:58 PM  Result Value Ref Range Status   Specimen Description URINE, CATHETERIZED  Final   Special Requests NONE  Final   Culture   Final    NO GROWTH Performed at [facility], [address] [place], [place] [address]    Report Status 05/14/2018 FINAL  Final  Radiology Studies: No results found.   Scheduled Meds: . aspirin  81 mg Oral Daily  . atorvastatin  40 mg Oral q1800  . bisacodyl  10 mg Rectal Once  . calcitRIOL  0.25 mcg Oral Daily  . Chlorhexidine Gluconate Cloth  6 each Topical Q0600  . clopidogrel  75 mg Oral Q breakfast  . darbepoetin (ARANESP) injection - DIALYSIS  200 mcg Intravenous Q Wed-HD  . midodrine  10 mg Oral TID WC  . polyethylene glycol  17 g Oral BID  . senna-docusate  1 tablet Oral BID  . sodium chloride flush  3 mL Intravenous Q12H  . sodium chloride flush  3 mL Intravenous Q12H   Continuous Infusions: . sodium chloride  10 mL/hr at 05/17/18 1400  . sodium chloride    . sodium chloride    . sodium chloride       LOS: 20 days   Time Spent in minutes   45 minutes (greater than 50% of time spent with patient face to face, as well as reviewing records, discussing with consultants, and formulating a plan)  Cristal Mollica D.O. on 05/22/2018 at 2:18 PM  Between 7am to 7pm - Please see pager noted on [email or web address]  After 7pm go to [email or web address]  And look for the night coverage person covering for me after hours  [facility] Office  [phone]

## 2018-05-22 NOTE — Progress Notes (Addendum)
Np on call notified of pt having a moderate to large size black tarry stool. Pt also c/o of abd cramping. Pt is on hep gtt. Tramadol given. Will continue to monitor pt & await any new orders. Hoover Brunette, RN   NP on call paged again at [phone] d/t pt still c/o of 8/10 abd pain & cramping. No other prns available besides tylenol. Will continue to monitor pt & await new orders. Hoover Brunette, RN

## 2018-05-22 NOTE — Social Work (Signed)
Sent clinical notes to patient's insurance for review for authorization for SNF. Will need to follow up with patient and daughter for SNF choice. Noted plans for HD access. CSW to follow.  Estanislado Emms, LCSW [phone]

## 2018-05-22 NOTE — Progress Notes (Signed)
Will not ambulate today due to pain, need for HD, and low activity tolerance. Yves Dill CES, ACSM 11:07 AM 05/22/2018

## 2018-05-22 NOTE — Consult Note (Addendum)
Hospital Consult    Reason for Consult:  Need for [facility] and permanent dialysis access Requesting Physician:  Dr. Posey Pronto MRN #:  [id number]  History of Present Illness: This is a 73 y.o. female who was admitted to the hospital on 05/15/2018 due to chest pain and was found to have anterior lateral myocardial infarction.  She underwent cardiac catheterization which demonstrated an occluded mid LAD.  She has been managed medically with recommendations from cardiology including addition of daily 75 mg of Plavix.  Due to cardiogenic shock requiring pressor support she went into renal failure and was started on dialysis via right temporary IJ catheter.  We were consulted to evaluate for catheter exchange as well as permanent dialysis access.  She is right hand dominant and would prefer dialysis access placement in left arm.  She is on IV heparin for atrial fibrillation.  Patient has shortness of breath with minimal activity, denies chest pain currently.  R IJ temp cath placed 05/14/18.  Past Medical History:  Diagnosis Date  . Hypertension   . Renal disorder     Past Surgical History:  Procedure Laterality Date  . LEFT HEART CATH AND CORONARY ANGIOGRAPHY N/A 05/15/2018   Procedure: LEFT HEART CATH AND CORONARY ANGIOGRAPHY;  Surgeon: Leonie Man, MD;  Location: [place] [facility];  Service: Cardiovascular;  Laterality: N/A;  . ULTRASOUND GUIDANCE FOR VASCULAR ACCESS  05/07/2018   Procedure: Ultrasound Guidance For Vascular Access;  Surgeon: Leonie Man, MD;  Location: [place] [facility];  Service: Cardiovascular;;    No Known Allergies  Prior to Admission medications   Medication Sig Start Date End Date Taking? Authorizing Provider  cloNIDine (CATAPRES) 0.1 MG tablet Take 0.1 mg by mouth 2 (two) times daily.   Yes [provider]  hydrochlorothiazide (HYDRODIURIL) 25 MG tablet Take 25 mg by mouth daily.   Yes [provider]  lisinopril (PRINIVIL,ZESTRIL) 40 MG tablet Take 40  mg by mouth daily.   Yes [provider]  metoprolol succinate (TOPROL-XL) 100 MG 24 hr tablet Take 100 mg by mouth daily. Take with or immediately following a meal.   Yes [provider]  potassium chloride SA (K-DUR,KLOR-CON) 20 MEQ tablet Take 20 mEq by mouth daily.   Yes [provider]  verapamil (CALAN-SR) 240 MG CR tablet Take 240 mg by mouth daily.   Yes [provider]    Social History   Socioeconomic History  . Marital status: Widowed    Spouse name: Not on file  . Number of children: Not on file  . Years of education: Not on file  . Highest education level: Not on file  Occupational History  . Not on file  Social Needs  . Financial resource strain: Not on file  . Food insecurity:    Worry: Not on file    Inability: Not on file  . Transportation needs:    Medical: Not on file    Non-medical: Not on file  Tobacco Use  . Smoking status: Current Every Day Smoker    Packs/day: 0.50    Types: Cigarettes  . Smokeless tobacco: Never Used  Substance and Sexual Activity  . Alcohol use: Not on file  . Drug use: Not on file  . Sexual activity: Not on file  Lifestyle  . Physical activity:    Days per week: Not on file    Minutes per session: Not on file  . Stress: Not on file  Relationships  . Social connections:  Talks on phone: Not on file    Gets together: Not on file    Attends religious service: Not on file    Active member of club or organization: Not on file    Attends meetings of clubs or organizations: Not on file    Relationship status: Not on file  . Intimate partner violence:    Fear of current or ex partner: Not on file    Emotionally abused: Not on file    Physically abused: Not on file    Forced sexual activity: Not on file  Other Topics Concern  . Not on file  Social History Narrative  . Not on file     History reviewed. No pertinent family history.  ROS: Otherwise negative unless mentioned in  HPI  Physical Examination  Vitals:   05/22/18 0500 05/22/18 0819  BP:  132/83  Pulse:  72  Resp:  (!) 26  Temp: 98.4 F (36.9 C) 97.7 F (36.5 C)  SpO2:  100%   Body mass index is 26.24 kg/m.  General:  WDWN somnolent Gait: Not observed HENT: WNL, normocephalic Pulmonary: mild inc respiratory effort Cardiac: irregular Abdomen:  soft, NT/ND, no masses Skin: without rashes Vascular Exam/Pulses: faintly palpable left radial pulse; dopplerable R radial Extremities: without ischemic changes, without Gangrene , without cellulitis; without open wounds;  Musculoskeletal: no muscle wasting or atrophy  Neurologic: A&O X 3;  Somnolent Psychiatric:  The pt has Normal affect. Lymph:  Unremarkable  CBC    Component Value Date/Time   WBC 17.5 (H) 05/22/2018 0510   RBC 2.36 (L) 05/22/2018 0510   HGB 7.8 (L) 05/22/2018 0510   HCT 24.1 (L) 05/22/2018 0510   PLT 78 (L) 05/22/2018 0510   MCV 102.1 (H) 05/22/2018 0510   MCH 33.1 05/22/2018 0510   MCHC 32.4 05/22/2018 0510   RDW 18.6 (H) 05/22/2018 0510   LYMPHSABS 2.9 05/07/2018 2154   MONOABS 1.1 (H) 05/15/2018 2154   EOSABS 0.1 05/03/2018 2154   BASOSABS 0.0 05/01/2018 2154    BMET    Component Value Date/Time   NA 129 (L) 05/22/2018 0510   K 5.2 (H) 05/22/2018 0510   CL 92 (L) 05/22/2018 0510   CO2 19 (L) 05/22/2018 0510   GLUCOSE 95 05/22/2018 0510   BUN 58 (H) 05/22/2018 0510   CREATININE 5.51 (H) 05/22/2018 0510   CALCIUM 8.4 (L) 05/22/2018 0510   GFRNONAA 7 (L) 05/22/2018 0510   GFRAA 8 (L) 05/22/2018 0510    COAGS: Lab Results  Component Value Date   INR 1.34 05/24/2018     Non-Invasive Vascular Imaging:   Vein mapping pending  ASSESSMENT/PLAN: This is a 73 y.o. female who presented to the hospital with MI with cardiogenic shock now in renal failure.  Hemodialysis has been initiated via right IJ temporary catheter  Patient will require exchange of temporary catheter for tunneled dialysis catheter If  patient deemed to be a surgical candidate, plan would be for left arm AV fistula versus graft Vein mapping pending Restrict left arm We will need to continue Plavix due to recent MI per cardiology On call vascular surgeon Dr. Ashwin Tibbs will evaluate the patient later today  Dagoberto Ligas PA-C [facility] [phone]  I have examined the patient, reviewed and agree with above.Currently on HD.  Very weak.  Able to answer questions. Will plan tunneled HD cath in OR tomorrow..Await vein map.  Will plan permanent left arm access following recovery from acute coronary event  Curt Jews, MD 05/22/2018 1:49 PM

## 2018-05-22 NOTE — Progress Notes (Signed)
Pt. Having bloody loose stools pt. Low bp as documented. Dr. Posey Pronto made aware orders received and documented for albumin. Pt asymptomatic.

## 2018-05-23 ENCOUNTER — Encounter (HOSPITAL_COMMUNITY): Payer: Self-pay

## 2018-05-23 ENCOUNTER — Encounter (HOSPITAL_COMMUNITY): Admission: EM | Disposition: E | Payer: Self-pay | Source: Home / Self Care | Attending: Internal Medicine

## 2018-05-23 ENCOUNTER — Encounter (HOSPITAL_COMMUNITY): Payer: Self-pay | Admitting: Anesthesiology

## 2018-05-23 LAB — CBC
HCT: 22 % — ABNORMAL LOW (ref 36.0–46.0)
Hemoglobin: 6.4 g/dL — CL (ref 12.0–15.0)
MCH: 30.8 pg (ref 26.0–34.0)
MCHC: 29.1 g/dL — ABNORMAL LOW (ref 30.0–36.0)
MCV: 105.8 fL — ABNORMAL HIGH (ref 80.0–100.0)
Platelets: 31 10*3/uL — ABNORMAL LOW (ref 150–400)
RBC: 2.08 MIL/uL — ABNORMAL LOW (ref 3.87–5.11)
RDW: 18.9 % — ABNORMAL HIGH (ref 11.5–15.5)
WBC: 10 10*3/uL (ref 4.0–10.5)
nRBC: 39.9 % — ABNORMAL HIGH (ref 0.0–0.2)

## 2018-05-23 LAB — PREPARE RBC (CROSSMATCH)

## 2018-05-23 LAB — ABO/RH: ABO/RH(D): O POS

## 2018-05-23 LAB — HEPARIN INDUCED PLATELET AB (HIT ANTIBODY): Heparin Induced Plt Ab: 0.202 OD (ref 0.000–0.400)

## 2018-05-23 SURGERY — INSERTION OF DIALYSIS CATHETER
Anesthesia: Monitor Anesthesia Care

## 2018-05-23 MED ORDER — LORAZEPAM 2 MG/ML IJ SOLN: 0.5000 mg | Freq: Four times a day (QID) | INTRAMUSCULAR | Status: DC | PRN

## 2018-05-23 MED ORDER — SODIUM CHLORIDE 0.9% IV SOLUTION
Freq: Once | INTRAVENOUS | Status: AC
  Administered 2018-05-23: 02:00:00 via INTRAVENOUS

## 2018-05-23 MED ORDER — BIOTENE DRY MOUTH MT LIQD: 15.0000 mL | OROMUCOSAL | Status: DC | PRN

## 2018-05-23 MED ORDER — HYDROMORPHONE HCL 1 MG/ML IJ SOLN: 0.5000 mg | INTRAMUSCULAR | Status: DC | PRN

## 2018-05-23 MED ORDER — GLYCOPYRROLATE 0.2 MG/ML IJ SOLN: 0.2000 mg | INTRAMUSCULAR | Status: DC | PRN

## 2018-05-23 MED ORDER — POLYVINYL ALCOHOL 1.4 % OP SOLN
2.0000 [drp] | OPHTHALMIC | Status: DC | PRN
  Filled 2018-05-23: qty 15

## 2018-05-23 MED ORDER — GLYCOPYRROLATE 1 MG PO TABS: 1.0000 mg | ORAL_TABLET | ORAL | Status: DC | PRN

## 2018-05-24 LAB — TYPE AND SCREEN
ABO/RH(D): O POS
Antibody Screen: NEGATIVE
Unit division: 0
Unit division: 0

## 2018-05-24 LAB — BPAM RBC
Blood Product Expiration Date: 202002202359
Blood Product Expiration Date: 202002202359
ISSUE DATE / TIME: 202001230508
Unit Type and Rh: 5100
Unit Type and Rh: 5100

## 2018-06-01 NOTE — Progress Notes (Signed)
CRITICAL VALUE ALERT  Critical Value: Hgb 6.4  Date & Time Notied:  2018/06/12 1am  Provider Notified: Raliegh Ip Schorr  Orders Received/Actions taken: Transfuse 2u PRBCs

## 2018-06-01 NOTE — Progress Notes (Signed)
Patient ID: Brandy Salazar, female   DOB: 1945-05-04, 73 y.o.   MRN: [id number] Progressive clinical deterioration.  Profoundly hypotensive.  Anemic.  Will not take to the OR for tunneled catheter.  Has functioning temporary catheter.  Will follow from sidelines.  If she makes clinical recovery, can place tunneled catheter in the future

## 2018-06-01 NOTE — Progress Notes (Signed)
PROGRESS NOTE    Brandy Salazar  WJX:[id number] DOB: 02/05/1946 DOA: 05/25/2018 PCP: Alroy Dust, L.Marlou Sa, MD   Brief Narrative:  73 year old female presented with chest pain and was found to have anterior lateral MI.  Cardiology had initially admitted this patient.  Patient underwent cardiac catheterization showing occluded mid LAD.  EF was 40 to 45%.  No intervention due to late presentation, and manage medically.  Hospital course was complicated by cardiogenic shock requiring pressors, atrial fibrillation, acute on chronic renal failure.  Nephrology currently consulted and following.  Cardiology has since signed off.   Today, patient was found to have hemoglobin of 6.4, 2 units of PRBC were ordered for transfusion.  Platelets were found to be 31.  Patient also noted to be very hypotensive and dyspneic.  Palliative care consulted, patient now comfort care. Assessment & Plan   Goals of care Patient today found to have hypotension, anemia, increased work of breathing.  Had long discussion with patient's daughter at bedside regarding prognosis. -Palliative care consulted and appreciated, patient now will transition to comfort care -expecting hospital death  Acute renal failure on chronic kidney disease, stage III -Creatinine continues to worsen, currently 5.51 -Likely secondary to hypotension with IV contrast -Nephrology consulted and appreciated, patient had right IJ HD cath placed and did require hemodialysis. -Had long discussion with patient's daughter regarding hemodialysis.  She states she not sure if she would like this for her mother. -Discussed with nephrology, Dr. Posey Pronto, initially plan was for ongoing dialysis and vascular surgery was consulted for access placement however given current situation, this has been canceled.  Patient now transition to comfort care.  Subacute anterior lateral STEMI -Cardiology had admitted the patient originally.  Patient did have late presentation,  catheterization showed an occluded mid LAD without significant other coronary artery disease, there were no collaterals except for faint left to left collaterals.  EF was 40 to 45%. -Intervention was deferred given late presentation.   -Patient currently on dual antiplatelet therapy -Previous hospitalist discussed with cardiology, would consider stopping aspirin when Eliquis is initiated.  Ischemic cardiomyopathy -Appears to be stable, volume management per HD/nephrology -As above, echocardiogram showed an EF of 40 to 45%  Paroxysmal atrial fibrillation -Patient currently in sinus rhythm -Unable to tolerate beta-blocker given bradycardia.  Amiodarone was discontinued due to sinus bradycardia. -Patient was on IV heparin however this was discontinued today given thrombocytopenia  Macrocytic anemia -O13 and folic acid levels WNL -Iron studies show deficiency consistent with anemia of chronic disease -hemoglobin on admission 11.8, however hemoglobin during hospital course has remained between 8 and 9.  Currently 6.4 -2 units PRBC ordered for transfusion  Thrombocytopenia -?  Unknown etiology -Heparin was discontinued on 05/22/2018, however platelets continue to drop, currently down to 31 -HIT panel was ordered  Dark stools and abdominal cramping -Per nursing documentation, patient had a large bowel movement overnight which is black tarry -Hemoglobin as above -Discussed with gastroenterology, Dr. Therisa Doyne, patient had a colonoscopy in September 2019 as she had history of polyps.  At that time she had several polyps removed.  Patient's hemoglobin had remained stable and was checked periodically and remained around 11.  Patient had also complained of melanotic stools as well as abdominal pain at that time.  Patient may require EGD as an outpatient -Heparin discontinued, HIT panel ordered  DVT Prophylaxis  SCDs  Code Status: DNR  Family Communication: Daughter at bedside  Disposition Plan:  Admitted.  As above, transition to comfort care.  Expecting  hospital death  Consultants Cardiology Nephrology PCCM Palliative care Vascular surgery  Procedures/Significant Events 1/2 Admit  1/6 Cardiac cath > subacute occlusion of LAD 1/6 TTE EF 40-45% 1/14 R IJ HD cath inserted  1/15 Started iHD 1/18 TRH assumed care   Antibiotics   Anti-infectives (From admission, onward)   None      Subjective:   [name] seen and examined today.  Patient currently resting. Objective:   Vitals:   Jun 20, 2018 0526 20-Jun-2018 0547 Jun 20, 2018 0729 20-Jun-2018 1117  BP: (!) 73/55  (!) 84/52   Pulse: 97 78    Resp: 20     Temp:    99.6 F (37.6 C)  TempSrc:    Axillary  SpO2:      Weight:      Height:        Intake/Output Summary (Last 24 hours) at 06-20-2018 1256 Last data filed at Jun 20, 2018 0845 Gross per 24 hour  Intake 1509.4 ml  Output -164 ml  Net 1673.4 ml   Filed Weights   05/22/18 0500 05/22/18 1100 05/22/18 1627  Weight: 67.2 kg 69 kg 68.9 kg   Exam  General: Well developed, chronically ill-appearing, NAD  HEENT: NCAT, mucous membranes moist.   Neck: Supple  Cardiovascular: S1 S2 auscultated, RRR, 3/6 SEM  Respiratory: Diminished, increased work of breathing  Abdomen: Soft, nontender, nondistended, + bowel sounds  Extremities: warm dry without cyanosis clubbing. LE edema B/L    Data Reviewed: I have personally reviewed following labs and imaging studies  CBC: Recent Labs  Lab 05/19/18 0543 05/20/18 0627 05/21/18 1025 05/22/18 0510 06/20/2018 0057  WBC 21.7* 17.4* 15.7* 17.5* 10.0  HGB 8.3* 7.9* 7.7* 7.8* 6.4*  HCT 25.8* 25.1* 24.4* 24.1* 22.0*  MCV 103.2* 102.9* 103.4* 102.1* 105.8*  PLT 154 122* PLATELET CLUMPS NOTED ON SMEAR, UNABLE TO ESTIMATE 78* 31*   Basic Metabolic Panel: Recent Labs  Lab 05/18/18 0453 05/19/18 0543 05/20/18 0627 05/21/18 0540 05/22/18 0510  NA 128* 132* 130* 133* 129*  K 5.0 5.0 5.2* 4.4 5.2*  CL 92* 96* 95* 96*  92*  CO2 20* 21* 22 23 19*  GLUCOSE 182* 144* 122* 156* 95  BUN 68* 44* 59* 36* 58*  CREATININE 6.05* 4.81* 6.19* 3.96* 5.51*  CALCIUM 8.9 8.6* 8.6* 8.1* 8.4*  PHOS 6.9* 5.4* 5.6* 5.6* 6.3*   GFR: Estimated Creatinine Clearance: 8.6 mL/min (A) (by C-G formula based on SCr of 5.51 mg/dL (H)). Liver Function Tests: Recent Labs  Lab 05/18/18 0453 05/19/18 0543 05/20/18 0627 05/21/18 0540 05/22/18 0510  ALBUMIN 2.5* 2.3* 2.4* 2.2* 2.2*   No results for input(s): LIPASE, AMYLASE in the last 168 hours. No results for input(s): AMMONIA in the last 168 hours. Coagulation Profile: No results for input(s): INR, PROTIME in the last 168 hours. Cardiac Enzymes: No results for input(s): CKTOTAL, CKMB, CKMBINDEX, TROPONINI in the last 168 hours. BNP (last 3 results) No results for input(s): PROBNP in the last 8760 hours. HbA1C: No results for input(s): HGBA1C in the last 72 hours. CBG: No results for input(s): GLUCAP in the last 168 hours. Lipid Profile: No results for input(s): CHOL, HDL, LDLCALC, TRIG, CHOLHDL, LDLDIRECT in the last 72 hours. Thyroid Function Tests: No results for input(s): TSH, T4TOTAL, FREET4, T3FREE, THYROIDAB in the last 72 hours. Anemia Panel: Recent Labs    05/21/18 0540  VITAMINB12 4,827*  FOLATE 13.0   Urine analysis: No results found for: COLORURINE, APPEARANCEUR, LABSPEC, [place], GLUCOSEU, [place], BILIRUBINUR, KETONESUR, PROTEINUR, [place], NITRITE, LEUKOCYTESUR  Sepsis Labs: @LABRCNTIP (procalcitonin:4,lacticidven:4)  ) No results found for this or any previous visit (from the past 240 hour(s)).    Radiology Studies: No results found.   Scheduled Meds:  Continuous Infusions:    LOS: 21 days   Time Spent in minutes   45 minutes (greater than 50% of time spent with patient face to face, as well as reviewing records, discussing with consultants, and formulating a plan)  Cristal Mettler D.O. on 05/26/2018 at 12:56 PM  Between 7am to 7pm  - Please see pager noted on [email or web address]  After 7pm go to [email or web address]  And look for the night coverage person covering for me after hours  [facility] Office  [phone]

## 2018-06-01 NOTE — Significant Event (Signed)
Rapid Response Event Note  Overview:  Called by charge RN for pt with bloody stool, hematuria, and hypotensive. States she had received an order for 280ml bolus earlier for BP.    Pt is a DNR. On arrival, pt non-responsive in bed. Will open eyes and move upper extremities minimally but does not follow commands. HR 74, BP 87/52, RR30, spO2 undetectable (RN looking for replacement cord). Lungs with fine crackles/diminished throughout. Dark red blood noted in foley bag, per RN pt had just been cleaned for large bloody stool, it appears she is actively bleeding with more blood noted on bed pad. Pt daughter at bedside. BP 72/46 when refreshed.   Interventions: NP- Schorr paged and to bedside to speak to daughter and clarify code status and goals of care. Will check blood levels and transfuse if needed- daughter agrees to blood products.  Stat CBC ordered- hgb 6.4 575ml bolus (250 prior to arrival, additional 250 on arrival)  Plan of Care (if not transferred): Continue to monitor pt status, transfuse 2u PRBCs. Family friend called by daughter who states they are coming to see pt. Chaplain called per family request. Continue to make pt comfortable and assist family as needed. Bedside RN instructed to call with any questions  Event Summary:  called at  0048  Arrived at 0053   event ended at  [place]

## 2018-06-01 NOTE — Death Summary Note (Addendum)
Death Summary  Brandy Salazar KXF:[id number] DOB: 1945/12/11 DOA: 05/26/2018  PCP: Alroy Dust, L.Marlou Sa, MD PCP/Office notified:   Admit date: 05/26/18 Date of Death: 06-16-2018  Final Diagnoses:  Active problems: Goals of care Acute renal failure on chronic kidney disease, stage III Subacute anterior lateral STEMI Ischemic cardiomyopathy Paroxysmal atrial fibrillation Macrocytic anemia Thrombocytopenia Dark stools and abdominal cramping   73 year old female presented with chest pain and was found to have anterior lateral MI.  Cardiology had initially admitted this patient.  Patient underwent cardiac catheterization showing occluded mid LAD.  EF was 40 to 45%.  No intervention due to late presentation, and manage medically.  Hospital course was complicated by cardiogenic shock requiring pressors, atrial fibrillation, acute on chronic renal failure.  Nephrology currently consulted and following.  Cardiology has since signed off.   Today, patient was found to have hemoglobin of 6.4, 2 units of PRBC were ordered for transfusion.  Platelets were found to be 31.  Patient also noted to be very hypotensive and dyspneic.  Palliative care consulted, patient now comfort care. Patient expired at 1525 on 06/16/2018.  Goals of care Patient today found to have hypotension, anemia, increased work of breathing.  Had long discussion with patient's daughter at bedside regarding prognosis. -Palliative care consulted and appreciated, patient now will transition to comfort care -expecting hospital death  Acute renal failure on chronic kidney disease, stage III -Creatinine continues to worsen, currently 5.51 -Likely secondary to hypotension with IV contrast -Nephrology consulted and appreciated, patient had right IJ HD cath placed and did require hemodialysis. -Had long discussion with patient's daughter regarding hemodialysis.  She states she not sure if she would like this for her mother. -Discussed with  nephrology, Dr. Posey Pronto, initially plan was for ongoing dialysis and vascular surgery was consulted for access placement however given current situation, this has been canceled.  Patient now transition to comfort care.  Subacute anterior lateral STEMI -Cardiology had admitted the patient originally.  Patient did have late presentation, catheterization showed an occluded mid LAD without significant other coronary artery disease, there were no collaterals except for faint left to left collaterals.  EF was 40 to 45%. -Intervention was deferred given late presentation.   -Patient currently on dual antiplatelet therapy -Previous hospitalist discussed with cardiology, would consider stopping aspirin when Eliquis is initiated.  Ischemic cardiomyopathy -Appears to be stable, volume management per HD/nephrology -As above, echocardiogram showed an EF of 40 to 45%  Paroxysmal atrial fibrillation -Patient currently in sinus rhythm -Unable to tolerate beta-blocker given bradycardia.  Amiodarone was discontinued due to sinus bradycardia. -Patient was on IV heparin however this was discontinued today given thrombocytopenia  Macrocytic anemia -I96 and folic acid levels WNL -Iron studies show deficiency consistent with anemia of chronic disease -hemoglobin on admission 11.8, however hemoglobin during hospital course has remained between 8 and 9.  Currently 6.4 -2 units PRBC ordered for transfusion  Thrombocytopenia -?  Unknown etiology -Heparin was discontinued on 05/22/2018, however platelets continue to drop, currently down to 31 -HIT panel was ordered  Dark stools and abdominal cramping -Per nursing documentation, patient had a large bowel movement overnight which is black tarry -Hemoglobin as above -Discussed with gastroenterology, Dr. Therisa Doyne, patient had a colonoscopy in September 2019 as she had history of polyps.  At that time she had several polyps removed.  Patient's hemoglobin had remained  stable and was checked periodically and remained around 11.  Patient had also complained of melanotic stools as well as abdominal pain at  that time.  Patient may require EGD as an outpatient -Heparin discontinued, HIT panel ordered  Signed:  Cristal Lucarelli  [facility] 05/24/2018, 3:57 PM

## 2018-06-01 NOTE — Progress Notes (Signed)
Chaplain responded to a page for support for an actively dying Pt. Daughter Sherrie Sport) and Pt friend(church lady) was at the bedside. Chaplain practiced pastoral presence by reading sacred scriptures and playing songs. Daughter request songs "remember " by the [place]. Chaplain facilitated story telling and remembrances. Pt was laboring in breathing while daughter held her hands. Chaplain left to allow some time to be private. Will be alert to return.    06/05/2018 0300  Clinical Encounter Type  Visited With Patient and family together  Visit Type Patient actively dying  Referral From Nurse  Spiritual Encounters  Spiritual Needs Prayer;Emotional;Grief support

## 2018-06-01 NOTE — Progress Notes (Signed)
Palliative:   Brandy Salazar is lying quietly in bed.  She appears to be actively dying, is not able to make her basic needs known.  She is unable to close her eyes fully.  Present today at bedside is daughter Gurney Maxin and longtime friend and neighbor Public relations account executive.  We talked about Brandy Salazar's current state, her bleeding, her new onset end-stage hemodialysis.  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  We talked about how to make choices for loved ones, what we can and cannot change.  Daughter states clearly multiple times that she would like FULL COMFORT care.  Daughter Annie Main and Pura Spice are in agreement of [place].    Exam: Brandy Salazar appears to be actively dying, she appears to be frail, unable to communicate.  Respiratory rate low 20s, abdomen soft, no swelling in lower extremities.  Discussion with nursing staff related to goals of care and needs.  Conference with social work related to goals of care, full comfort care. Conference with hospitalist related to goals of care, full comfort care.  Plan:  Comfort care, orders made.  Daughter is requesting transfer to [address] palliative unit. Prognosis: Brandy Salazar appears to be active gently dying, I anticipate a hospital death within the next 24 to 48 hours.  She is too unstable for transfer to residential hospice.  50 minutes, extended time Quinn Axe, NP  Palliative Medicine Team  Team Phone # [phone]  Greater than 50% of this time was spent counseling and coordinating care related to the above assessment and plan.

## 2018-06-01 NOTE — Care Management Important Message (Signed)
Important Message  Patient Details  Name: Brandy Salazar MRN: [id number] Date of Birth: [birth year]   Medicare Important Message Given:  Yes    Alin Chavira P Loralyn Rachel 05/31/18, 11:16 AM

## 2018-06-01 NOTE — Progress Notes (Signed)
PT Cancellation Note  Patient Details Name: ADALINA DOPSON MRN: [id number] DOB: 1945-10-09   Cancelled Treatment:    Reason Eval/Treat Not Completed: Medical issues which prohibited therapy(Low HGb, bloody urine and stool.  Nurse states [place] 2018/06/16, 9:03 AM  [facility] Pager:  [phone]  Office:  [phone]

## 2018-06-01 NOTE — Social Work (Signed)
Noted plan for comfort care. Will remain available for support as needed.  Estanislado Emms, LCSW [phone]

## 2018-06-01 NOTE — Progress Notes (Signed)
Patient ID: Brandy Salazar, female   DOB: May 28, 1945, 73 y.o.   MRN: [id number] [facility] Progress Note   Assessment/ Plan:   1.  Acute kidney injury on chronic kidney disease stage IV: Suspected likely to be from relative hypotension/ATN along with contrast mediated nephropathy.  Anuric overnight and without any evidence of renal recovery; did not tolerate dialysis well yesterday because of hypotension and intermittent melena.  I appreciate vascular surgery seen her and agree that she is not in any position for tunneled dialysis catheter placement as mortality appears to be imminent. 2.  Hyponatremia: Due to free water excretion defect in the setting of acute kidney injury, will continue fluid restriction and attempt correction with ultrafiltration/hemodialysis. 3.  Coronary artery disease status post subacute anterolateral STEMI: With late presentation and occluded mid LAD with limited intervention. 4.  Paroxysmal atrial fibrillation: Currently rate controlled, on anticoagulant therapy with heparin drip along with aspirin and Plavix. 5.  Prognosis: She appears to be actively dying and I support palliative care helping [place] with this transition towards comfort care.  Subjective:   Acute overnight events noted with protracted hypotension and continued changes in mentation.   Objective:   BP (!) 84/52 (BP Location: Right Arm)   Pulse 78   Temp 99.6 F (37.6 C) (Axillary)   Resp 20   Ht 5\' 3"  (1.6 m)   Wt 68.9 kg   SpO2 96%   BMI 26.91 kg/m   Intake/Output Summary (Last 24 hours) at 2018-06-15 1133 Last data filed at 06/15/18 0845 Gross per 24 hour  Intake 1509.4 ml  Output -164 ml  Net 1673.4 ml   Weight change: 1.8 kg  Physical Exam: Gen: Somnolent, moribund CVS: Pulse regular rhythm, normal rate, S1 and S2 with 3/6 HSM Resp: Diminished breath sounds over bases-poor inspiratory effort, no distinct rales Abd: Soft, obese, mild tenderness over LLQ and suprapubic  area.  Ext: 2-3+ lower extremity edema  Labs: BMET Recent Labs  Lab 05/17/18 0426 05/18/18 0453 05/19/18 0543 05/20/18 0627 05/21/18 0540 05/22/18 0510  NA 130* 128* 132* 130* 133* 129*  K 4.4 5.0 5.0 5.2* 4.4 5.2*  CL 96* 92* 96* 95* 96* 92*  CO2 22 20* 21* 22 23 19*  GLUCOSE 122* 182* 144* 122* 156* 95  BUN 56* 68* 44* 59* 36* 58*  CREATININE 5.00* 6.05* 4.81* 6.19* 3.96* 5.51*  CALCIUM 8.5* 8.9 8.6* 8.6* 8.1* 8.4*  PHOS 4.8* 6.9* 5.4* 5.6* 5.6* 6.3*   CBC Recent Labs  Lab 05/20/18 0627 05/21/18 1025 05/22/18 0510 06-15-18 0057  WBC 17.4* 15.7* 17.5* 10.0  HGB 7.9* 7.7* 7.8* 6.4*  HCT 25.1* 24.4* 24.1* 22.0*  MCV 102.9* 103.4* 102.1* 105.8*  PLT 122* PLATELET CLUMPS NOTED ON SMEAR, UNABLE TO ESTIMATE 78* 31*   Medications:     Elmarie Shiley, MD 2018-06-15, 11:33 AM

## 2018-06-01 DEATH — deceased

## 2020-10-26 IMAGING — DX DG CHEST 1V PORT
1 series · 1 of 1 positions shown · non-contrast
Comparison: Report 07/08/1998

CLINICAL DATA: STEMI

EXAM:
PORTABLE CHEST 1 VIEW

[chest ap]
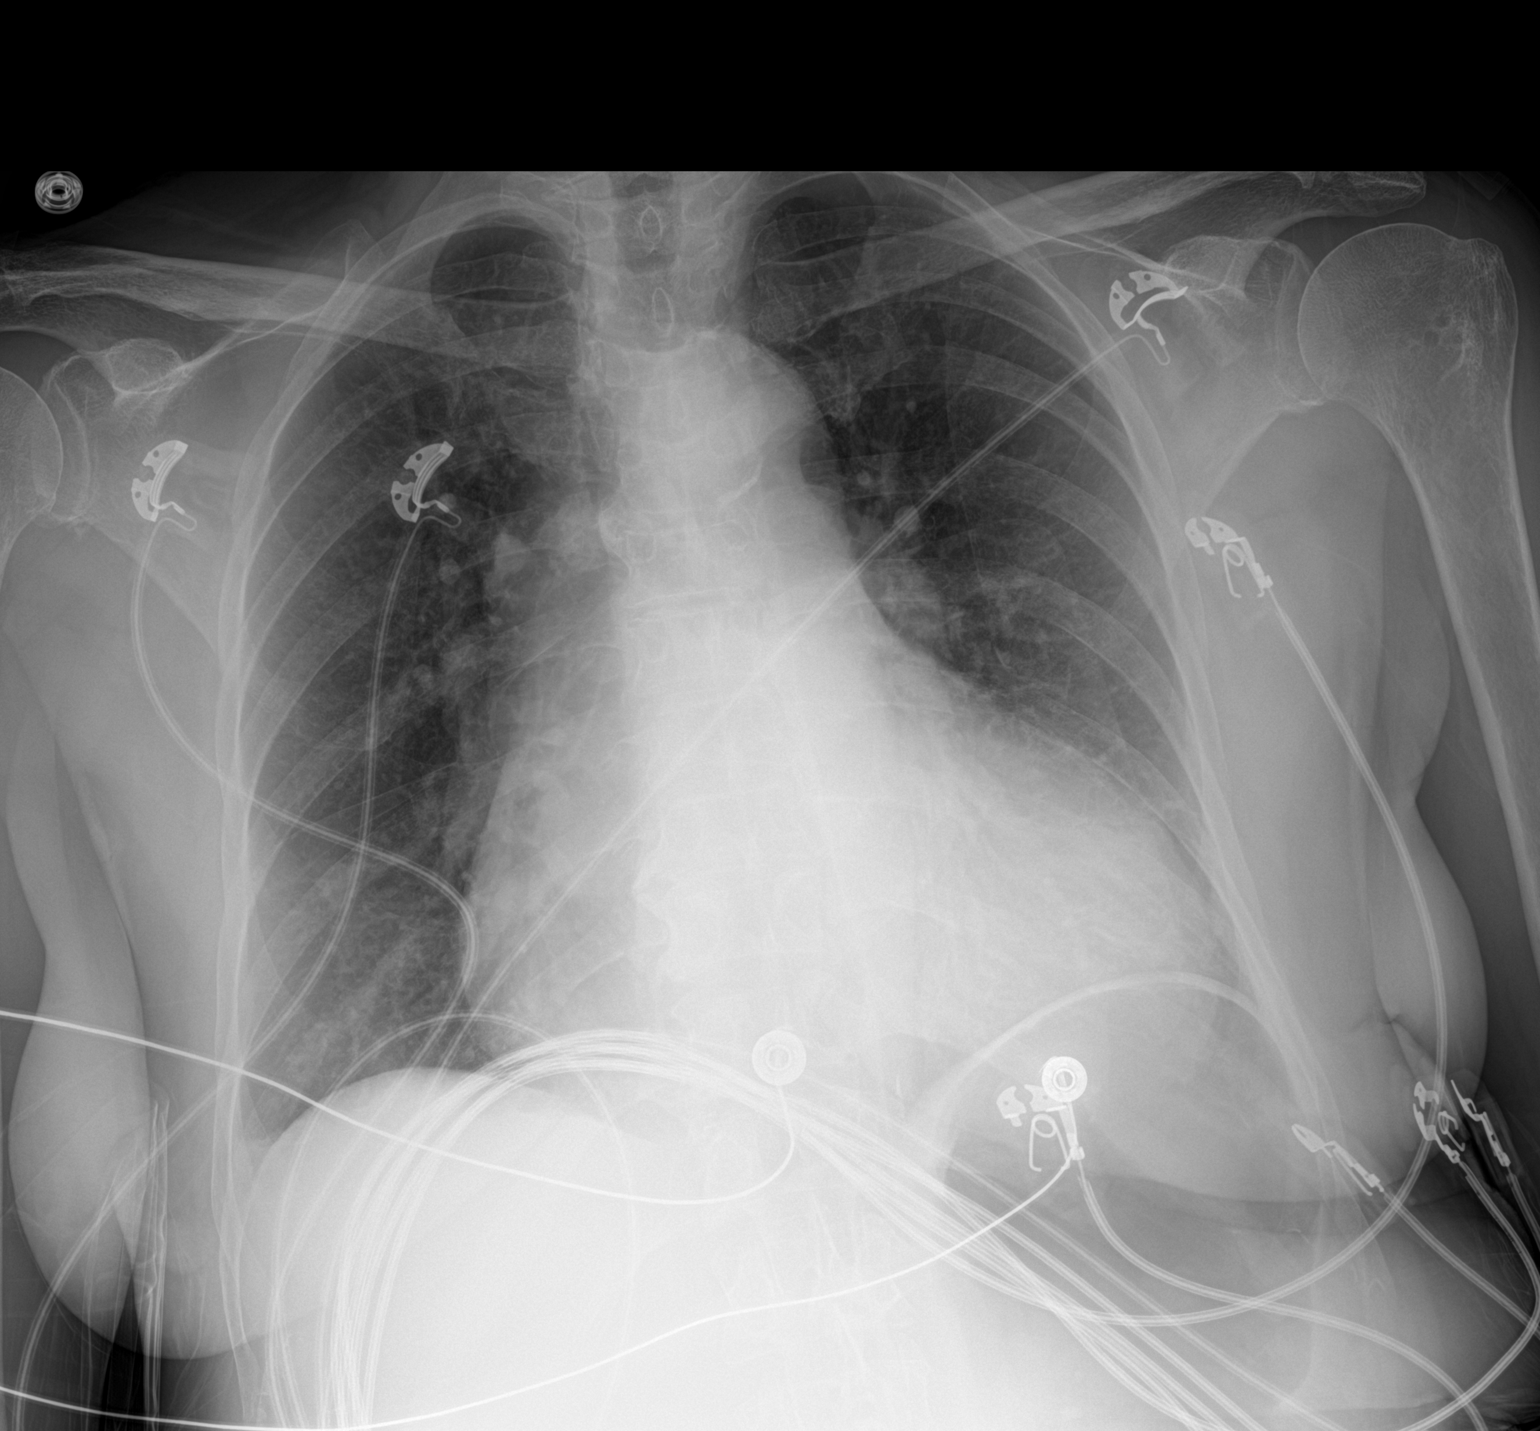

[1 of 1 positions shown; findings below may reference images not displayed]

FINDINGS: Mild to moderate cardiomegaly. No acute consolidation or effusion.
Aortic atherosclerosis. No pneumothorax.
IMPRESSION: No active disease.  Cardiomegaly without edema.

## 2020-11-02 IMAGING — DX DG CHEST 1V PORT
1 series · 1 of 1 positions shown · non-contrast
Comparison: 05/03/2018

CLINICAL DATA: Right posterior base rales.  Cough.

EXAM:
PORTABLE CHEST 1 VIEW

[chest]
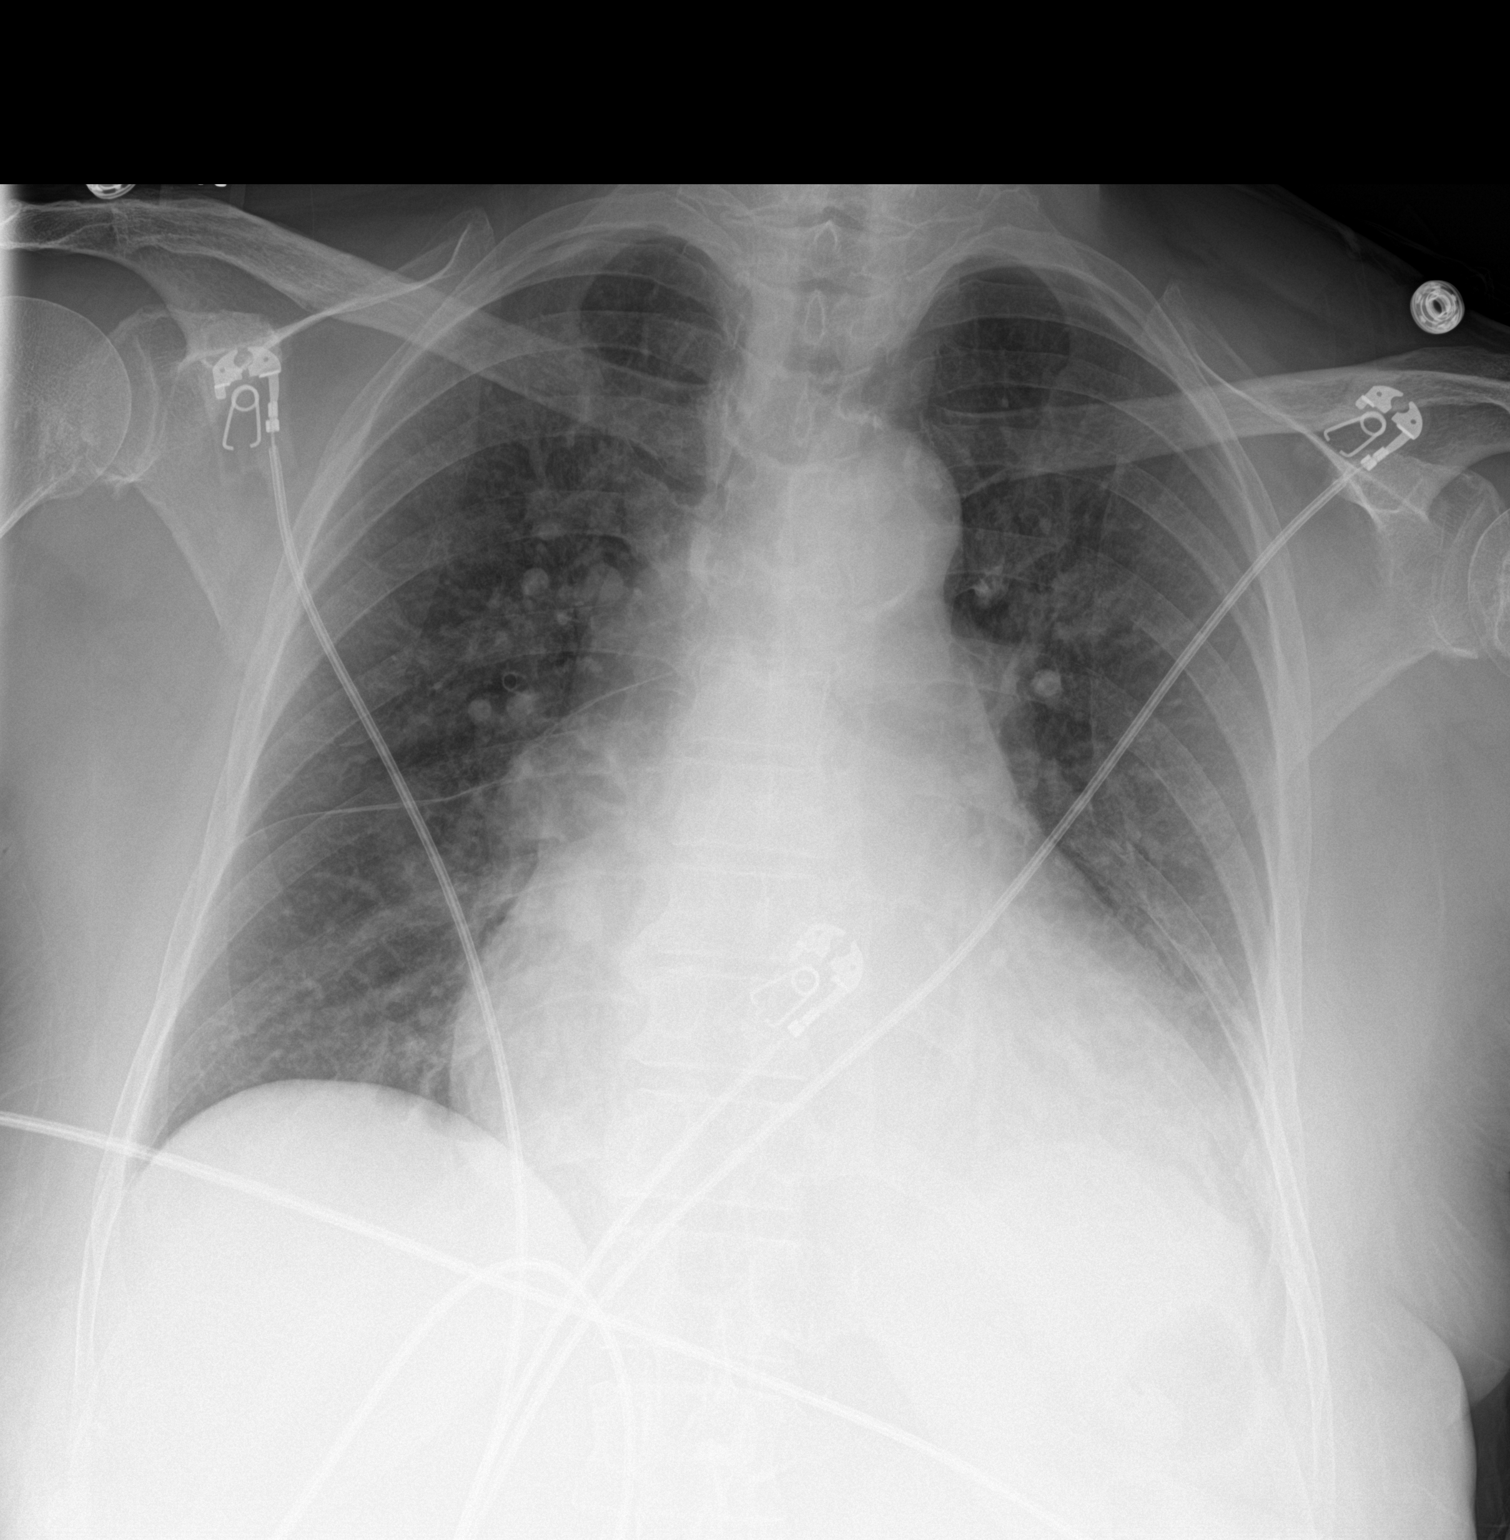

[1 of 1 positions shown; findings below may reference images not displayed]

FINDINGS: Enlarged cardiac silhouette. Mediastinal contours appear intact.
Calcific atherosclerotic disease of the aorta.

There is no evidence of focal airspace consolidation, pleural
effusion or pneumothorax. Pulmonary vascular congestion.

Osseous structures are without acute abnormality. Soft tissues are
grossly normal.
IMPRESSION: No evidence of lobar consolidation.

Pulmonary vascular congestion.

Enlarged cardiac silhouette.
